# Patient Record
Sex: Female | Born: 1950
Health system: Southern US, Community
[De-identification: ages and names within clinical notes are randomized; demographics above are authoritative.]

## PROBLEM LIST (undated history)

## (undated) DIAGNOSIS — I10 Essential (primary) hypertension: Secondary | ICD-10-CM

## (undated) DIAGNOSIS — E079 Disorder of thyroid, unspecified: Secondary | ICD-10-CM

## (undated) HISTORY — PX: ABDOMINAL HYSTERECTOMY: SHX81

---

## 1997-07-21 ENCOUNTER — Ambulatory Visit (HOSPITAL_COMMUNITY): Admission: RE | Admit: 1997-07-21 | Discharge: 1997-07-21 | Payer: Self-pay | Admitting: Obstetrics and Gynecology

## 1998-03-18 ENCOUNTER — Other Ambulatory Visit: Admission: RE | Admit: 1998-03-18 | Discharge: 1998-03-18 | Payer: Self-pay | Admitting: Obstetrics and Gynecology

## 1998-08-29 ENCOUNTER — Ambulatory Visit (HOSPITAL_COMMUNITY): Admission: RE | Admit: 1998-08-29 | Discharge: 1998-08-29 | Payer: Self-pay | Admitting: Obstetrics and Gynecology

## 1999-05-29 ENCOUNTER — Other Ambulatory Visit: Admission: RE | Admit: 1999-05-29 | Discharge: 1999-05-29 | Payer: Self-pay | Admitting: Obstetrics and Gynecology

## 1999-09-13 ENCOUNTER — Ambulatory Visit (HOSPITAL_COMMUNITY): Admission: RE | Admit: 1999-09-13 | Discharge: 1999-09-13 | Payer: Self-pay | Admitting: Obstetrics and Gynecology

## 1999-09-13 ENCOUNTER — Encounter: Payer: Self-pay | Admitting: Obstetrics and Gynecology

## 2000-06-13 ENCOUNTER — Other Ambulatory Visit: Admission: RE | Admit: 2000-06-13 | Discharge: 2000-06-13 | Payer: Self-pay | Admitting: Obstetrics and Gynecology

## 2001-01-22 ENCOUNTER — Ambulatory Visit (HOSPITAL_COMMUNITY): Admission: RE | Admit: 2001-01-22 | Discharge: 2001-01-22 | Payer: Self-pay | Admitting: Obstetrics and Gynecology

## 2001-01-22 ENCOUNTER — Encounter: Payer: Self-pay | Admitting: Obstetrics and Gynecology

## 2001-07-29 ENCOUNTER — Other Ambulatory Visit: Admission: RE | Admit: 2001-07-29 | Discharge: 2001-07-29 | Payer: Self-pay | Admitting: Obstetrics and Gynecology

## 2002-08-14 ENCOUNTER — Other Ambulatory Visit: Admission: RE | Admit: 2002-08-14 | Discharge: 2002-08-14 | Payer: Self-pay | Admitting: Obstetrics and Gynecology

## 2003-01-19 ENCOUNTER — Other Ambulatory Visit: Admission: RE | Admit: 2003-01-19 | Discharge: 2003-01-19 | Payer: Self-pay | Admitting: Gynecology

## 2003-08-30 ENCOUNTER — Other Ambulatory Visit: Admission: RE | Admit: 2003-08-30 | Discharge: 2003-08-30 | Payer: Self-pay | Admitting: Obstetrics and Gynecology

## 2004-02-23 ENCOUNTER — Other Ambulatory Visit: Admission: RE | Admit: 2004-02-23 | Discharge: 2004-02-23 | Payer: Self-pay | Admitting: Obstetrics and Gynecology

## 2004-10-03 ENCOUNTER — Other Ambulatory Visit: Admission: RE | Admit: 2004-10-03 | Discharge: 2004-10-03 | Payer: Self-pay | Admitting: Obstetrics and Gynecology

## 2004-12-15 ENCOUNTER — Ambulatory Visit (HOSPITAL_BASED_OUTPATIENT_CLINIC_OR_DEPARTMENT_OTHER): Admission: RE | Admit: 2004-12-15 | Discharge: 2004-12-15 | Payer: Self-pay | Admitting: General Surgery

## 2004-12-15 ENCOUNTER — Ambulatory Visit (HOSPITAL_COMMUNITY): Admission: RE | Admit: 2004-12-15 | Discharge: 2004-12-15 | Payer: Self-pay | Admitting: General Surgery

## 2004-12-15 ENCOUNTER — Encounter (INDEPENDENT_AMBULATORY_CARE_PROVIDER_SITE_OTHER): Payer: Self-pay | Admitting: *Deleted

## 2007-02-17 ENCOUNTER — Ambulatory Visit: Payer: Self-pay | Admitting: Gastroenterology

## 2007-03-03 ENCOUNTER — Ambulatory Visit: Payer: Self-pay | Admitting: Gastroenterology

## 2010-10-06 NOTE — Op Note (Signed)
Linda Goodwin, [name]                   ACCOUNT NO.:  [id number]   MEDICAL RECORD NO.:  [id number]          PATIENT TYPE:  AMB   LOCATION:  DSC                          FACILITY:  [facility]   PHYSICIAN:  Gita Kudo, M.D. DATE OF BIRTH:  July 26, 1950   DATE OF PROCEDURE:  12/15/2004  DATE OF DISCHARGE:                                 OPERATIVE REPORT   OPERATIVE PROCEDURE:  Excision left thigh mass, excision of skin lesion left  thigh.   SURGEON:  Gita Kudo, M.D.   ANESTHESIA:  MAC - IV sedation, local mixture 1% Xylocaine - 0.5% Marcaine  with epinephrine.   PREOPERATIVE DIAGNOSIS:  Mass left thigh, possible lipoma, nevus left thigh.   POSTOPERATIVE DIAGNOSIS:  Mass left thigh, possible lipoma, nevus left  thigh.   CLINICAL SUMMARY:  60 year old female found a lump in her left thigh that  she did not notice before approximately a month or so ago. She also has a  nevus nearby. Comes in for excision of both.   OPERATIVE FINDINGS:  The mass in the upper medial thigh was above the fascia  and deep to the subcu and looked like a benign lipoma approximately 5 cm in  length. The nevus appeared benign.   OPERATIVE PROCEDURE:  Under satisfactory intravenous sedation, the patient  was positioned, prepped and draped in standard fashion. Approximately 30 mL  total of the above local was infiltrated for good analgesia. The mass which  had been previously marked was approached through an oblique incision  centered over it. Dissection in the subcu revealed the mass which was fairly  well encapsulated and lying on top of the fascia but not invading into the  muscle at all. It was easily removed by cautery. The wound lavaged with  saline and then closed in layers with 2-0, 3-0 Vicryl and nylon for skin.   The nevus was then excised in elliptical fashion and closed in layers with  Vicryl and nylon. Sterile absorbent dressings applied to both areas and the  patient went to the recovery room  from the operating room in good condition.       MRL/MEDQ  D:  12/15/2004  T:  12/15/2004  Job:  [id number]   cc:   Malva Limes, M.D.  [address]  [place]  [place] [address]  Fax: [phone]

## 2015-11-11 DIAGNOSIS — T148 Other injury of unspecified body region: Secondary | ICD-10-CM | POA: Diagnosis not present

## 2015-11-19 DIAGNOSIS — K529 Noninfective gastroenteritis and colitis, unspecified: Secondary | ICD-10-CM | POA: Diagnosis not present

## 2015-11-19 DIAGNOSIS — R110 Nausea: Secondary | ICD-10-CM | POA: Diagnosis not present

## 2015-12-24 DIAGNOSIS — H5200 Hypermetropia, unspecified eye: Secondary | ICD-10-CM | POA: Diagnosis not present

## 2015-12-24 DIAGNOSIS — H5210 Myopia, unspecified eye: Secondary | ICD-10-CM | POA: Diagnosis not present

## 2016-03-28 DIAGNOSIS — E0500 Thyrotoxicosis with diffuse goiter without thyrotoxic crisis or storm: Secondary | ICD-10-CM | POA: Diagnosis not present

## 2016-03-28 DIAGNOSIS — Z5181 Encounter for therapeutic drug level monitoring: Secondary | ICD-10-CM | POA: Diagnosis not present

## 2016-05-01 DIAGNOSIS — E0590 Thyrotoxicosis, unspecified without thyrotoxic crisis or storm: Secondary | ICD-10-CM | POA: Diagnosis not present

## 2016-05-07 DIAGNOSIS — E0500 Thyrotoxicosis with diffuse goiter without thyrotoxic crisis or storm: Secondary | ICD-10-CM | POA: Diagnosis not present

## 2016-05-07 DIAGNOSIS — Z5181 Encounter for therapeutic drug level monitoring: Secondary | ICD-10-CM | POA: Diagnosis not present

## 2016-05-07 DIAGNOSIS — E0590 Thyrotoxicosis, unspecified without thyrotoxic crisis or storm: Secondary | ICD-10-CM | POA: Diagnosis not present

## 2016-09-05 DIAGNOSIS — Z1389 Encounter for screening for other disorder: Secondary | ICD-10-CM | POA: Diagnosis not present

## 2016-09-05 DIAGNOSIS — Z01419 Encounter for gynecological examination (general) (routine) without abnormal findings: Secondary | ICD-10-CM | POA: Diagnosis not present

## 2016-09-05 DIAGNOSIS — Z131 Encounter for screening for diabetes mellitus: Secondary | ICD-10-CM | POA: Diagnosis not present

## 2016-09-05 DIAGNOSIS — Z1329 Encounter for screening for other suspected endocrine disorder: Secondary | ICD-10-CM | POA: Diagnosis not present

## 2016-09-05 DIAGNOSIS — Z1231 Encounter for screening mammogram for malignant neoplasm of breast: Secondary | ICD-10-CM | POA: Diagnosis not present

## 2016-11-13 DIAGNOSIS — E0500 Thyrotoxicosis with diffuse goiter without thyrotoxic crisis or storm: Secondary | ICD-10-CM | POA: Diagnosis not present

## 2016-11-13 DIAGNOSIS — Z5181 Encounter for therapeutic drug level monitoring: Secondary | ICD-10-CM | POA: Diagnosis not present

## 2016-11-13 DIAGNOSIS — E0590 Thyrotoxicosis, unspecified without thyrotoxic crisis or storm: Secondary | ICD-10-CM | POA: Diagnosis not present

## 2016-11-13 DIAGNOSIS — Z79899 Other long term (current) drug therapy: Secondary | ICD-10-CM | POA: Diagnosis not present

## 2016-12-17 DIAGNOSIS — L239 Allergic contact dermatitis, unspecified cause: Secondary | ICD-10-CM | POA: Diagnosis not present

## 2016-12-17 DIAGNOSIS — R030 Elevated blood-pressure reading, without diagnosis of hypertension: Secondary | ICD-10-CM | POA: Diagnosis not present

## 2017-01-04 DIAGNOSIS — R1013 Epigastric pain: Secondary | ICD-10-CM | POA: Diagnosis not present

## 2017-01-04 DIAGNOSIS — H9201 Otalgia, right ear: Secondary | ICD-10-CM | POA: Diagnosis not present

## 2017-01-04 DIAGNOSIS — R21 Rash and other nonspecific skin eruption: Secondary | ICD-10-CM | POA: Diagnosis not present

## 2017-02-04 DIAGNOSIS — E0590 Thyrotoxicosis, unspecified without thyrotoxic crisis or storm: Secondary | ICD-10-CM | POA: Diagnosis not present

## 2017-02-04 DIAGNOSIS — R1013 Epigastric pain: Secondary | ICD-10-CM | POA: Diagnosis not present

## 2017-02-04 DIAGNOSIS — Z23 Encounter for immunization: Secondary | ICD-10-CM | POA: Diagnosis not present

## 2017-02-04 DIAGNOSIS — Z0000 Encounter for general adult medical examination without abnormal findings: Secondary | ICD-10-CM | POA: Diagnosis not present

## 2017-02-04 DIAGNOSIS — Z136 Encounter for screening for cardiovascular disorders: Secondary | ICD-10-CM | POA: Diagnosis not present

## 2017-02-04 DIAGNOSIS — M546 Pain in thoracic spine: Secondary | ICD-10-CM | POA: Diagnosis not present

## 2017-02-04 DIAGNOSIS — D72824 Basophilia: Secondary | ICD-10-CM | POA: Diagnosis not present

## 2017-02-19 DIAGNOSIS — E0500 Thyrotoxicosis with diffuse goiter without thyrotoxic crisis or storm: Secondary | ICD-10-CM | POA: Diagnosis present

## 2017-02-19 DIAGNOSIS — R1013 Epigastric pain: Secondary | ICD-10-CM | POA: Diagnosis not present

## 2017-02-19 DIAGNOSIS — K219 Gastro-esophageal reflux disease without esophagitis: Secondary | ICD-10-CM | POA: Diagnosis present

## 2017-03-11 DIAGNOSIS — K648 Other hemorrhoids: Secondary | ICD-10-CM | POA: Diagnosis not present

## 2017-03-11 DIAGNOSIS — K219 Gastro-esophageal reflux disease without esophagitis: Secondary | ICD-10-CM | POA: Diagnosis not present

## 2017-03-11 DIAGNOSIS — K5730 Diverticulosis of large intestine without perforation or abscess without bleeding: Secondary | ICD-10-CM | POA: Diagnosis not present

## 2017-03-11 DIAGNOSIS — Z1211 Encounter for screening for malignant neoplasm of colon: Secondary | ICD-10-CM | POA: Diagnosis not present

## 2017-03-25 ENCOUNTER — Encounter: Payer: Self-pay | Admitting: Gastroenterology

## 2017-03-25 DIAGNOSIS — M8588 Other specified disorders of bone density and structure, other site: Secondary | ICD-10-CM | POA: Diagnosis not present

## 2017-04-02 DIAGNOSIS — I10 Essential (primary) hypertension: Secondary | ICD-10-CM | POA: Diagnosis not present

## 2017-04-02 DIAGNOSIS — M546 Pain in thoracic spine: Secondary | ICD-10-CM | POA: Diagnosis not present

## 2017-05-16 DIAGNOSIS — I10 Essential (primary) hypertension: Secondary | ICD-10-CM | POA: Diagnosis not present

## 2017-05-16 DIAGNOSIS — M546 Pain in thoracic spine: Secondary | ICD-10-CM | POA: Diagnosis not present

## 2017-05-24 DIAGNOSIS — Z5181 Encounter for therapeutic drug level monitoring: Secondary | ICD-10-CM | POA: Diagnosis not present

## 2017-05-24 DIAGNOSIS — E0590 Thyrotoxicosis, unspecified without thyrotoxic crisis or storm: Secondary | ICD-10-CM | POA: Diagnosis not present

## 2017-05-24 DIAGNOSIS — Z79899 Other long term (current) drug therapy: Secondary | ICD-10-CM | POA: Diagnosis not present

## 2017-05-24 DIAGNOSIS — E0500 Thyrotoxicosis with diffuse goiter without thyrotoxic crisis or storm: Secondary | ICD-10-CM | POA: Diagnosis not present

## 2017-05-28 ENCOUNTER — Other Ambulatory Visit: Payer: Self-pay | Admitting: Family Medicine

## 2017-05-28 DIAGNOSIS — M545 Low back pain, unspecified: Secondary | ICD-10-CM

## 2017-05-28 DIAGNOSIS — M546 Pain in thoracic spine: Secondary | ICD-10-CM

## 2017-06-01 ENCOUNTER — Ambulatory Visit (HOSPITAL_BASED_OUTPATIENT_CLINIC_OR_DEPARTMENT_OTHER)
Admission: RE | Admit: 2017-06-01 | Discharge: 2017-06-01 | Disposition: A | Discharge status: Home / Self Care | Payer: Medicare HMO | Source: Ambulatory Visit | Attending: Family Medicine | Admitting: Family Medicine

## 2017-06-01 DIAGNOSIS — M4854XA Collapsed vertebra, not elsewhere classified, thoracic region, initial encounter for fracture: Secondary | ICD-10-CM | POA: Diagnosis not present

## 2017-06-01 DIAGNOSIS — M546 Pain in thoracic spine: Secondary | ICD-10-CM | POA: Diagnosis not present

## 2017-06-01 DIAGNOSIS — X58XXXA Exposure to other specified factors, initial encounter: Secondary | ICD-10-CM | POA: Insufficient documentation

## 2017-06-01 DIAGNOSIS — M47816 Spondylosis without myelopathy or radiculopathy, lumbar region: Secondary | ICD-10-CM | POA: Insufficient documentation

## 2017-06-01 DIAGNOSIS — M545 Low back pain, unspecified: Secondary | ICD-10-CM

## 2017-06-07 DIAGNOSIS — M546 Pain in thoracic spine: Secondary | ICD-10-CM | POA: Diagnosis not present

## 2017-07-18 DIAGNOSIS — E0590 Thyrotoxicosis, unspecified without thyrotoxic crisis or storm: Secondary | ICD-10-CM | POA: Diagnosis not present

## 2017-07-18 DIAGNOSIS — E0500 Thyrotoxicosis with diffuse goiter without thyrotoxic crisis or storm: Secondary | ICD-10-CM | POA: Diagnosis not present

## 2017-07-30 ENCOUNTER — Encounter: Payer: Self-pay | Admitting: Physical Therapy

## 2017-07-30 ENCOUNTER — Other Ambulatory Visit: Payer: Self-pay

## 2017-07-30 ENCOUNTER — Ambulatory Visit: Payer: Medicare HMO | Attending: Orthopedic Surgery | Admitting: Physical Therapy

## 2017-07-30 DIAGNOSIS — R293 Abnormal posture: Secondary | ICD-10-CM | POA: Diagnosis not present

## 2017-07-30 DIAGNOSIS — M546 Pain in thoracic spine: Secondary | ICD-10-CM | POA: Insufficient documentation

## 2017-07-30 NOTE — Therapy (Signed)
[facility] [place], [place], [address] Phone: [phone]   Fax:  [phone]  Physical Therapy Evaluation  Patient Details  Name: Linda Goodwin MRN: [id number] Date of Birth: May 18, 1951 Referring Provider: Phylliss Bob, Md   Encounter Date: 07/30/2017  PT End of Session - 07/30/17 0859    Visit Number  1    Number of Visits  12    Date for PT Re-Evaluation  09/10/17    Authorization Type  FOTO every 5th visit    PT Start Time  0817    PT Stop Time  0859    PT Time Calculation (min)  42 min       History reviewed. No pertinent past medical history.  History reviewed. No pertinent surgical history.  There were no vitals filed for this visit.   Subjective Assessment - 07/30/17 1242    Subjective  Patient presents to physical therapy with mid-thoracic pain. Patient she fell in late May-early June which may be the cause of the start of pain. She had minimal pain after fall but her pain increased about six months ago (September 2018) Patient stated she had recently had an MRI to which it showed a small healed fracture in the vertebrae and very mild arthritic changes. Patient describes pain as nagging and achy. Pain at worst occurs in the morning, when bending forward, and lying down7/10. Pain at best is 2-3/10 and improves with exercise and movement. Patient noted with occasional tingling down spine and across mid back. Patient's goals are to decrease pain and increase level of activities.    Pertinent History  HTN    Limitations  House hold activities;Lifting    Diagnostic tests  MRI- old fracture and mild arthritic changes    Patient Stated Goals  get rid of pain and increase activity levels    Currently in Pain?  Yes    Pain Score  6     Pain Location  Back    Pain Orientation  Mid    Pain Descriptors / Indicators  Nagging;Aching    Pain Type  Chronic pain    Pain Onset  More than a month ago    Pain Frequency   Intermittent    Aggravating Factors   laying down    Pain Relieving Factors  moving or walking    Effect of Pain on Daily Activities  increase pain with household activities         [facility] PT Assessment - 07/30/17 0001      Assessment   Medical Diagnosis  Thoracic pain    Referring Provider  Phylliss Bob, Md    Onset Date/Surgical Date  01/30/17    Prior Therapy  no      Balance Screen   Has the patient fallen in the past 6 months  No    Has the patient had a decrease in activity level because of a fear of falling?   No    Is the patient reluctant to leave their home because of a fear of falling?   No      Home Environment   Living Environment  Private residence    Living Arrangements  Spouse/significant other      Prior Function   Level of Independence  Independent    Vocation  Full time employment      Observation/Other Assessments   Focus on Therapeutic Outcomes (FOTO)   33% limited      Posture/Postural Control  Posture/Postural Control  Postural limitations    Postural Limitations  Rounded Shoulders;Forward head;Increased lumbar lordosis;Increased thoracic kyphosis      ROM / Strength   AROM / PROM / Strength  AROM;Strength      AROM   Overall AROM   Within functional limits for tasks performed    Overall AROM Comments  cervical and shoulder AROM WFL; lumbar flexion finger tip to floor:7.5 inches (+) "pulling" in mid back      Strength   Overall Strength Comments  bilateral middle trapezius: 3+/5, bilateral lower trapeizus 3/5    Strength Assessment Site  Thoracic;Shoulder    Right/Left Shoulder  Left;Right    Right Shoulder Flexion  4+/5    Right Shoulder ABduction  4/5    Right Shoulder Internal Rotation  4+/5    Right Shoulder External Rotation  4+/5    Left Shoulder Flexion  4+/5    Left Shoulder ABduction  4/5    Left Shoulder Internal Rotation  4+/5    Left Shoulder External Rotation  4+/5      Palpation   Palpation comment  increased tender to  palpation around T5-T7 paraspinals,decreased thoracic joint play mobility T1-T8 2/6       Special Tests    Special Tests  Lumbar    Lumbar Tests  Slump Test      Slump test   Findings  Negative    Comment  no reproduction of thoracic neurological symptoms             Objective measurements completed on examination: See above findings.              PT Education - 07/30/17 1257    Education provided  Yes    Education Details  scapular retactions, shoulder rolls, corner stretch    Person(s) Educated  Patient    Methods  Explanation;Demonstration;Handout    Comprehension  Verbalized understanding;Returned demonstration          PT Long Term Goals - 07/30/17 1257      PT LONG TERM GOAL #1   Title  Patient will be independent with HEP.    Time  6    Period  Weeks    Status  New    Target Date  09/10/17      PT LONG TERM GOAL #2   Title  Patient will improve shoulder/scapular strength to 5/5 to improve stability during functional activities    Time  6    Period  Weeks    Status  New    Target Date  09/10/17      PT LONG TERM GOAL #3   Title  Patient will improve lumbar flexion finger tip to floor to less than 5 inches with no reports of "pulling" in lumbar spine    Time  6    Period  Weeks    Status  New    Target Date  09/10/17      PT LONG TERM GOAL #4   Title  Patient will improve FOTO score to less than 20% limited.    Time  6    Period  Weeks    Status  New    Target Date  09/10/17             Plan - 07/30/17 1231    Clinical Impression Statement  Patient is a 67 year old female who presents with chronic mid-thoracic pain around T5-T7. Patient has decreased thoracic spinal joint mobility and decreased middle  trapezius and lower trapezius strength. Patient noted with increased thoracic kyphosis in sitting and standing. Patient also noted with increased tenderness to palpation along T5-T7 paraspinals. Patient demonstrated [facility] cervical and  shoulder AROM and lumbar flexion finger tip to floor limited secondary to "pulling" in mid back. Patient would benefit from skilled physical therapy to decrease pain, improve thoracic mobility, and improve overall posture to return to PLOF.     Clinical Presentation  Stable    Clinical Decision Making  Low    Rehab Potential  Excellent    PT Frequency  2x / week    PT Duration  6 weeks    PT Treatment/Interventions  ADLs/Self Care Home Management;Electrical Stimulation;Ultrasound;Moist Heat;Dry needling;Passive range of motion;Manual techniques;Therapeutic exercise;Therapeutic activities;Taping;Cryotherapy    PT Next Visit Plan  UBE, postural exercises in standing and prone. Modalites PRN for pain relief    PT Home Exercise Plan  Scapular retractions, shoulder rolls with emphasis on scap rectractions, and corner stretch    Consulted and Agree with Plan of Care  Patient       Patient will benefit from skilled therapeutic intervention in order to improve the following deficits and impairments:  Hypomobility, Decreased strength, Pain, Postural dysfunction  Visit Diagnosis: Pain in thoracic spine  Abnormal posture     Problem List There are no active problems to display for this patient.  Gabriela Eves, PT, DPT  07/30/2017, 8:56 PM  [facility] [address] [place], [place], [address] Phone: [phone]   Fax:  [phone]  Name: SHINIKA ESTELLE MRN: [id number] Date of Birth: [birth year]

## 2017-07-30 NOTE — Patient Instructions (Signed)
   Ladiamond Gallina, PT, DPT [facility] [address] [place], [place], [address] Phone: [phone]   Fax:  [phone]  

## 2017-08-01 ENCOUNTER — Encounter: Payer: Self-pay | Admitting: Physical Therapy

## 2017-08-01 ENCOUNTER — Ambulatory Visit: Payer: Medicare HMO | Admitting: Physical Therapy

## 2017-08-01 DIAGNOSIS — R293 Abnormal posture: Secondary | ICD-10-CM | POA: Diagnosis not present

## 2017-08-01 DIAGNOSIS — M546 Pain in thoracic spine: Secondary | ICD-10-CM | POA: Diagnosis not present

## 2017-08-01 NOTE — Therapy (Signed)
[facility] [place], [place], [address] Phone: [phone]   Fax:  [phone]  Physical Therapy Treatment  Patient Details  Name: Linda Goodwin MRN: [id number] Date of Birth: [birth year] Referring Provider: Phylliss Bob, Md   Encounter Date: 08/01/2017  PT End of Session - 08/01/17 0808    Visit Number  2    Number of Visits  12    Date for PT Re-Evaluation  09/10/17    Authorization Type  FOTO every 5th visit    PT Start Time  0733    PT Stop Time  0821    PT Time Calculation (min)  48 min    Activity Tolerance  Patient tolerated treatment well    Behavior During Therapy  [facility] for tasks assessed/performed       History reviewed. No pertinent past medical history.  History reviewed. No pertinent surgical history.  There were no vitals filed for this visit.  Subjective Assessment - 08/01/17 0737    Subjective  Patient arrived with some discomfort and would like to get back to normal    Pertinent History  HTN    Limitations  House hold activities;Lifting    Diagnostic tests  MRI- old fracture and mild arthritic changes    Currently in Pain?  Yes    Pain Score  4     Pain Location  Thoracic    Pain Descriptors / Indicators  Aching;Nagging;Discomfort    Pain Type  Chronic pain    Pain Onset  More than a month ago    Pain Frequency  Intermittent    Aggravating Factors   first thing in the morning    Pain Relieving Factors  movement and activity                      [facility] Adult PT Treatment/Exercise - 08/01/17 0001      Exercises   Exercises  Shoulder      Shoulder Exercises: Prone   Retraction  Strengthening;Both;10 reps    Flexion  Strengthening;Both;10 reps    Horizontal ABduction 1  Strengthening;Both;10 reps      Shoulder Exercises: ROM/Strengthening   UBE (Upper Arm Bike)  x98min @ 120RPM    Wall Pushups  20 reps      Modalities   Modalities  Electrical Stimulation;Moist Heat       Moist Heat Therapy   Number Minutes Moist Heat  15 Minutes    Moist Heat Location  Other (comment) thoracis      Electrical Stimulation   Electrical Stimulation Location  thoracic    Electrical Stimulation Action  IFC 80-150hz  x38min    Electrical Stimulation Goals  Pain;Tone      Manual Therapy   Manual Therapy  Soft tissue mobilization;Myofascial release    Soft tissue mobilization  manual trigger point release/STW to bil thoracic paraspinals to reduce pain and tightness             PT Education - 08/01/17 0814    Education provided  Yes    Education Details  HEP    Person(s) Educated  Patient    Methods  Explanation;Demonstration;Handout    Comprehension  Returned demonstration;Verbalized understanding          PT Long Term Goals - 08/01/17 0809      PT LONG TERM GOAL #1   Title  Patient will be independent with HEP.    Time  6    Period  Weeks    Status  On-going      PT LONG TERM GOAL #2   Title  Patient will improve shoulder/scapular strength to 5/5 to improve stability during functional activities    Time  6    Period  Weeks    Status  On-going      PT LONG TERM GOAL #3   Title  Patient will improve lumbar flexion finger tip to floor to less than 5 inches with no reports of "pulling" in lumbar spine    Time  6    Period  Weeks    Status  On-going      PT LONG TERM GOAL #4   Title  Patient will improve FOTO score to less than 20% limited.    Time  6    Period  Weeks    Status  On-going            Plan - 08/01/17 1610    Clinical Impression Statement  Patient tolerated treatment well today. Patient able to start thoracic/posture strengthening today. Patient given HEP. Patient has increased tightness in bil thoracic area with palpable discomfort. Patient understands importance of posture awareness techniques and strength progression. Goals ongoing at this time.     Rehab Potential  Excellent    PT Frequency  2x / week    PT Duration  6 weeks     PT Treatment/Interventions  ADLs/Self Care Home Management;Electrical Stimulation;Ultrasound;Moist Heat;Dry needling;Passive range of motion;Manual techniques;Therapeutic exercise;Therapeutic activities;Taping;Cryotherapy    PT Next Visit Plan  cont with POC for postural exercises in standing and prone/STW/ Modalites PRN for pain relief    Consulted and Agree with Plan of Care  Patient       Patient will benefit from skilled therapeutic intervention in order to improve the following deficits and impairments:  Hypomobility, Decreased strength, Pain, Postural dysfunction  Visit Diagnosis: Pain in thoracic spine  Abnormal posture     Problem List There are no active problems to display for this patient.   Phillips Climes, PTA 08/01/2017, 8:22 AM  [facility] [place], [place], [address] Phone: [phone]   Fax:  [phone]  Name: ZURY FAZZINO MRN: [id number] Date of Birth: 05-24-1950

## 2017-08-01 NOTE — Patient Instructions (Addendum)
Scapular Retraction: Abduction (Prone)    Lie with upper arms straight out from sides, elbows bent to 90. Pinch shoulder blades together and raise arms a few inches from floor. Repeat _10___ times per set. Do __1-2__ sets per session. Do _1-2___ sessions per day.   Scapular Retraction: Abduction / Extension (Prone)    Lie with arms out from sides 90. Pinch shoulder blades together and raise arms a few inches from floor. Repeat _10___ times per set. Do _1-2___ sets per session. Do __1-2__ sessions per day.   Scapular Retraction: Flexion (Prone)    Lie with arms forward. Pinch shoulder blades together and raise arms a few inches from floor. Repeat _10___ times per set. Do _1-2___ sets per session. Do _1-2___ sessions per day.

## 2017-08-08 ENCOUNTER — Ambulatory Visit: Payer: Medicare HMO | Admitting: Physical Therapy

## 2017-08-08 ENCOUNTER — Encounter: Payer: Self-pay | Admitting: Physical Therapy

## 2017-08-08 DIAGNOSIS — M546 Pain in thoracic spine: Secondary | ICD-10-CM | POA: Diagnosis not present

## 2017-08-08 DIAGNOSIS — R293 Abnormal posture: Secondary | ICD-10-CM | POA: Diagnosis not present

## 2017-08-08 NOTE — Therapy (Signed)
[facility] [place], [place], [address] Phone: [phone]   Fax:  [phone]  Physical Therapy Treatment  Patient Details  Name: Linda Goodwin MRN: [id number] Date of Birth: 08-11-1950 Referring Provider: Phylliss Bob, Md   Encounter Date: 08/08/2017  PT End of Session - 08/08/17 1737    Visit Number  3    Number of Visits  12    Date for PT Re-Evaluation  09/10/17    Authorization Type  FOTO every 5th visit    PT Start Time  1649    PT Stop Time  1732    PT Time Calculation (min)  43 min    Activity Tolerance  Patient tolerated treatment well    Behavior During Therapy  [facility] for tasks assessed/performed       History reviewed. No pertinent past medical history.  History reviewed. No pertinent surgical history.  There were no vitals filed for this visit.  Subjective Assessment - 08/08/17 1703    Subjective  Reports today is actually a better day for pain. Reports her back feels better but she is still sore. Reports a nagging, sore pain.    Pertinent History  HTN    Limitations  House hold activities;Lifting    Diagnostic tests  MRI- old fracture and mild arthritic changes    Patient Stated Goals  get rid of pain and increase activity levels    Currently in Pain?  Yes    Pain Score  4     Pain Location  Back    Pain Orientation  Mid    Pain Descriptors / Indicators  Nagging;Sore    Pain Type  Chronic pain    Pain Onset  More than a month ago    Pain Frequency  Constant    Aggravating Factors   Mornings    Pain Relieving Factors  Heat, massage         [facility] PT Assessment - 08/08/17 0001      Assessment   Medical Diagnosis  Thoracic pain    Onset Date/Surgical Date  01/30/17    Prior Therapy  no                  [facility] Adult PT Treatment/Exercise - 08/08/17 0001      Shoulder Exercises: Prone   Other Prone Exercises  WITTY AROM x4 reps      Shoulder Exercises: Standing   Horizontal  ABduction  Strengthening;Both;15 reps;Theraband    Theraband Level (Shoulder Horizontal ABduction)  Level 1 (Yellow)    External Rotation  Strengthening;Both;15 reps;Theraband    Theraband Level (Shoulder External Rotation)  Level 1 (Yellow)    Other Standing Exercises  B D2 strengthening yellow theraband x15 reps each    Other Standing Exercises  Wall walks yellow theraband x2; Snow angels x15 reps      Shoulder Exercises: ROM/Strengthening   UBE (Upper Arm Bike)  90 RPM x6 min (3 min forward, 3 min backward)    Wall Pushups  20 reps    X to V Arms  x10 reps      Modalities   Modalities  Electrical Stimulation;Moist Heat      Moist Heat Therapy   Number Minutes Moist Heat  15 Minutes    Moist Heat Location  Other (comment) Thoracic spine      Electrical Stimulation   Electrical Stimulation Location  B thoracic paraspinals    Electrical Stimulation Action  IFC    Electrical  Stimulation Parameters  80-150 hz x15 min    Electrical Stimulation Goals  Pain                  PT Long Term Goals - 08/01/17 0809      PT LONG TERM GOAL #1   Title  Patient will be independent with HEP.    Time  6    Period  Weeks    Status  On-going      PT LONG TERM GOAL #2   Title  Patient will improve shoulder/scapular strength to 5/5 to improve stability during functional activities    Time  6    Period  Weeks    Status  On-going      PT LONG TERM GOAL #3   Title  Patient will improve lumbar flexion finger tip to floor to less than 5 inches with no reports of "pulling" in lumbar spine    Time  6    Period  Weeks    Status  On-going      PT LONG TERM GOAL #4   Title  Patient will improve FOTO score to less than 20% limited.    Time  6    Period  Weeks    Status  On-going            Plan - 08/08/17 1802    Clinical Impression Statement  Patient guided through postural exercises without complaint of pain. Patient still experiencing "nagging, constant" especially  surrounding the fracture site. Patient guided through treatment with demo and VCs to ensure proper form with emphasis on proper posture. Normal modalities response noted following removal of the modalities.    Rehab Potential  Excellent    PT Frequency  2x / week    PT Duration  6 weeks    PT Treatment/Interventions  ADLs/Self Care Home Management;Electrical Stimulation;Ultrasound;Moist Heat;Dry needling;Passive range of motion;Manual techniques;Therapeutic exercise;Therapeutic activities;Taping;Cryotherapy    PT Next Visit Plan  cont with POC for postural exercises in standing and prone/STW/ Modalites PRN for pain relief    PT Home Exercise Plan  Scapular retractions, shoulder rolls with emphasis on scap rectractions, and corner stretch    Consulted and Agree with Plan of Care  Patient       Patient will benefit from skilled therapeutic intervention in order to improve the following deficits and impairments:  Hypomobility, Decreased strength, Pain, Postural dysfunction  Visit Diagnosis: Pain in thoracic spine  Abnormal posture     Problem List There are no active problems to display for this patient.   Standley Brooking, PTA 08/08/2017, 6:14 PM  [facility] [place], [place], [address] Phone: [phone]   Fax:  [phone]  Name: Linda Goodwin MRN: [id number] Date of Birth: 01/01/1951

## 2017-08-09 ENCOUNTER — Encounter: Payer: Self-pay | Admitting: Physical Therapy

## 2017-08-09 ENCOUNTER — Ambulatory Visit: Payer: Medicare HMO | Admitting: Physical Therapy

## 2017-08-09 DIAGNOSIS — M546 Pain in thoracic spine: Secondary | ICD-10-CM

## 2017-08-09 DIAGNOSIS — R293 Abnormal posture: Secondary | ICD-10-CM | POA: Diagnosis not present

## 2017-08-09 NOTE — Therapy (Signed)
[facility] [place], [place], [address] Phone: [phone]   Fax:  [phone]  Physical Therapy Treatment  Patient Details  Name: Linda Goodwin MRN: [id number] Date of Birth: March 27, 1951 Referring Provider: Phylliss Bob, Md   Encounter Date: 08/09/2017  PT End of Session - 08/09/17 3295    Visit Number  4    Number of Visits  12    Date for PT Re-Evaluation  09/10/17    Authorization Type  FOTO every 5th visit    PT Start Time  0817    PT Stop Time  0905    PT Time Calculation (min)  48 min    Activity Tolerance  Patient tolerated treatment well    Behavior During Therapy  [facility] for tasks assessed/performed       History reviewed. No pertinent past medical history.  History reviewed. No pertinent surgical history.  There were no vitals filed for this visit.  Subjective Assessment - 08/09/17 0817    Subjective  Reports that she feels pretty good and the pain has definately been worse.    Pertinent History  HTN    Limitations  House hold activities;Lifting    Diagnostic tests  MRI- old fracture and mild arthritic changes    Patient Stated Goals  get rid of pain and increase activity levels    Currently in Pain?  Yes    Pain Score  3     Pain Location  Back    Pain Orientation  Mid    Pain Descriptors / Indicators  Discomfort    Pain Type  Chronic pain    Pain Onset  More than a month ago         [facility] PT Assessment - 08/09/17 0001      Assessment   Medical Diagnosis  Thoracic pain    Onset Date/Surgical Date  01/30/17    Next MD Visit  None    Prior Therapy  no                  [facility] Adult PT Treatment/Exercise - 08/09/17 0001      Shoulder Exercises: Prone   Other Prone Exercises  WITTY AROM x5 reps      Shoulder Exercises: Standing   Horizontal ABduction  Strengthening;Both;20 reps;Theraband    Theraband Level (Shoulder Horizontal ABduction)  Level 1 (Yellow)    External Rotation   Strengthening;Both;20 reps;Theraband    Theraband Level (Shoulder External Rotation)  Level 1 (Yellow)    Row  Strengthening;Both;20 reps;Limitations    Row Limitations  Pink XTS    Other Standing Exercises  B D2 strengthening yellow theraband x20 reps each      Shoulder Exercises: ROM/Strengthening   UBE (Upper Arm Bike)  90 RPM x6 min (3 min forward, 3 min backward)    Lat Pull  Other (comment) pink XTS x20 reps    Wall Wash  BUE CW/CCW x20 reps    Wall Pushups  20 reps    "W" Arms  2x10 reps    X to V Arms  2x10 reps      Shoulder Exercises: Stretch   Corner Stretch  4 reps;10 seconds      Modalities   Modalities  Electrical Stimulation;Moist Heat      Moist Heat Therapy   Number Minutes Moist Heat  15 Minutes    Moist Heat Location  Other (comment) Thoracic spine      Acupuncturist  Stimulation Location  B thoracic paraspinals    Electrical Stimulation Action  IFC    Electrical Stimulation Parameters  80-150 hz x15 min    Electrical Stimulation Goals  Pain                  PT Long Term Goals - 08/01/17 0809      PT LONG TERM GOAL #1   Title  Patient will be independent with HEP.    Time  6    Period  Weeks    Status  On-going      PT LONG TERM GOAL #2   Title  Patient will improve shoulder/scapular strength to 5/5 to improve stability during functional activities    Time  6    Period  Weeks    Status  On-going      PT LONG TERM GOAL #3   Title  Patient will improve lumbar flexion finger tip to floor to less than 5 inches with no reports of "pulling" in lumbar spine    Time  6    Period  Weeks    Status  On-going      PT LONG TERM GOAL #4   Title  Patient will improve FOTO score to less than 20% limited.    Time  6    Period  Weeks    Status  On-going            Plan - 08/09/17 0944    Clinical Impression Statement  Patient continues to tolerate treatment well with no complaints of any increased pain. Patient  continues to report pain as a nagging pain. Improve form noted with prone WITTY exercises today. Normal modalities response noted following removal of the modalities.    Rehab Potential  Excellent    PT Frequency  2x / week    PT Duration  6 weeks    PT Treatment/Interventions  ADLs/Self Care Home Management;Electrical Stimulation;Ultrasound;Moist Heat;Dry needling;Passive range of motion;Manual techniques;Therapeutic exercise;Therapeutic activities;Taping;Cryotherapy    PT Next Visit Plan  cont with POC for postural exercises in standing and prone/STW/ Modalites PRN for pain relief    PT Home Exercise Plan  Scapular retractions, shoulder rolls with emphasis on scap rectractions, and corner stretch    Consulted and Agree with Plan of Care  Patient       Patient will benefit from skilled therapeutic intervention in order to improve the following deficits and impairments:  Hypomobility, Decreased strength, Pain, Postural dysfunction  Visit Diagnosis: Pain in thoracic spine  Abnormal posture     Problem List There are no active problems to display for this patient.   Standley Brooking, PTA 08/09/2017, 9:46 AM  [facility] [address] [place], [place], [address] Phone: [phone]   Fax:  [phone]  Name: Linda Goodwin MRN: [id number] Date of Birth: August 11, 1950

## 2017-08-13 ENCOUNTER — Encounter: Payer: Self-pay | Admitting: *Deleted

## 2017-08-13 ENCOUNTER — Ambulatory Visit: Payer: Medicare HMO | Admitting: *Deleted

## 2017-08-13 DIAGNOSIS — R293 Abnormal posture: Secondary | ICD-10-CM | POA: Diagnosis not present

## 2017-08-13 DIAGNOSIS — M546 Pain in thoracic spine: Secondary | ICD-10-CM | POA: Diagnosis not present

## 2017-08-13 NOTE — Therapy (Signed)
[facility] [place], [place], [address] Phone: [phone]   Fax:  [phone]  Physical Therapy Treatment  Patient Details  Name: Linda Goodwin MRN: [id number] Date of Birth: 1951-01-08 Referring Provider: Phylliss Bob, Md   Encounter Date: 08/13/2017  PT End of Session - 08/13/17 4709    Visit Number  5    Number of Visits  12    Date for PT Re-Evaluation  09/10/17    Authorization Type  FOTO every 5th visit      5th visit taken    PT Start Time  0815    PT Stop Time  0904    PT Time Calculation (min)  49 min       History reviewed. No pertinent past medical history.  History reviewed. No pertinent surgical history.  There were no vitals filed for this visit.  Subjective Assessment - 08/13/17 0821    Subjective  Doing better today with mainly soreness    Pertinent History  HTN    Limitations  House hold activities;Lifting    Diagnostic tests  MRI- old fracture and mild arthritic changes    Patient Stated Goals  get rid of pain and increase activity levels    Currently in Pain?  Yes    Pain Score  3     Pain Location  Back    Pain Orientation  Mid    Pain Descriptors / Indicators  Discomfort    Pain Onset  More than a month ago    Pain Frequency  Constant                No data recorded       [facility] Adult PT Treatment/Exercise - 08/13/17 0001      Shoulder Exercises: Standing   Row  Strengthening;Both;20 reps;Limitations 4x10    Row Limitations  Pink XTS      Shoulder Exercises: ROM/Strengthening   UBE (Upper Arm Bike)  90 RPM x6 min (3 min forward, 3 min backward)    Lat Pull  Other (comment) pink XTS 2 x20 reps, ROWS 4x10    Wall Pushups  20 reps    "W" Arms  3x10 reps      Shoulder Exercises: Stretch   Corner Stretch  5 reps;30 seconds      Modalities   Modalities  Electrical Stimulation;Moist Heat      Moist Heat Therapy   Number Minutes Moist Heat  15 Minutes    Moist Heat  Location  Other (comment) Thoracic spine      Electrical Stimulation   Electrical Stimulation Location  B thoracic paraspinals IFC x 15 mins 80-150hz     Electrical Stimulation Goals  Pain                  PT Long Term Goals - 08/01/17 0809      PT LONG TERM GOAL #1   Title  Patient will be independent with HEP.    Time  6    Period  Weeks    Status  On-going      PT LONG TERM GOAL #2   Title  Patient will improve shoulder/scapular strength to 5/5 to improve stability during functional activities    Time  6    Period  Weeks    Status  On-going      PT LONG TERM GOAL #3   Title  Patient will improve lumbar flexion finger tip to floor to less than 5  inches with no reports of "pulling" in lumbar spine    Time  6    Period  Weeks    Status  On-going      PT LONG TERM GOAL #4   Title  Patient will improve FOTO score to less than 20% limited.    Time  6    Period  Weeks    Status  On-going            Plan - 08/13/17 0857    Clinical Impression Statement  Pt arrived today doing fairly well with pain 3-4/10. She was able to perform all posture and strengthening Exs with minimal complaint except fatigue. Her FOTO improved to 31% limited today. Normal response to modalities.     Rehab Potential  Excellent    PT Frequency  2x / week    PT Duration  6 weeks    PT Treatment/Interventions  ADLs/Self Care Home Management;Electrical Stimulation;Ultrasound;Moist Heat;Dry needling;Passive range of motion;Manual techniques;Therapeutic exercise;Therapeutic activities;Taping;Cryotherapy    PT Next Visit Plan  cont with POC for postural exercises in standing and prone/STW/ Modalites PRN for pain relief    PT Home Exercise Plan  Scapular retractions, shoulder rolls with emphasis on scap rectractions, and corner stretch       Patient will benefit from skilled therapeutic intervention in order to improve the following deficits and impairments:  Hypomobility, Decreased strength,  Pain, Postural dysfunction  Visit Diagnosis: Pain in thoracic spine  Abnormal posture     Problem List There are no active problems to display for this patient.   Gearldean Lomanto,CHRIS, PTA 08/13/2017, 9:10 AM  [facility] [place], [place], [address] Phone: [phone]   Fax:  [phone]  Name: Linda Goodwin MRN: [id number] Date of Birth: 1951/05/14

## 2017-08-15 ENCOUNTER — Encounter: Payer: Self-pay | Admitting: *Deleted

## 2017-08-15 ENCOUNTER — Ambulatory Visit: Payer: Medicare HMO | Admitting: *Deleted

## 2017-08-15 DIAGNOSIS — M546 Pain in thoracic spine: Secondary | ICD-10-CM

## 2017-08-15 DIAGNOSIS — R293 Abnormal posture: Secondary | ICD-10-CM

## 2017-08-15 NOTE — Therapy (Signed)
[facility] [place], [place], [address] Phone: [phone]   Fax:  [phone]  Physical Therapy Treatment  Patient Details  Name: Linda Goodwin MRN: [id number] Date of Birth: 03/05/1951 Referring Provider: Phylliss Bob, Md   Encounter Date: 08/15/2017  PT End of Session - 08/15/17 1726    Visit Number  6    Number of Visits  12    Date for PT Re-Evaluation  09/10/17    Authorization Type  FOTO every 5th visit      5th visit taken 31%    PT Start Time  1651    PT Stop Time  1735    PT Time Calculation (min)  44 min       History reviewed. No pertinent past medical history.  History reviewed. No pertinent surgical history.  There were no vitals filed for this visit.  Subjective Assessment - 08/15/17 1653    Subjective  Doing better today with mainly soreness. Did ok after last Rx    Limitations  House hold activities;Lifting    Diagnostic tests  MRI- old fracture and mild arthritic changes    Patient Stated Goals  get rid of pain and increase activity levels    Currently in Pain?  Yes    Pain Score  3     Pain Location  Back    Pain Orientation  Mid                No data recorded       [facility] Adult PT Treatment/Exercise - 08/15/17 0001      Shoulder Exercises: Standing   Extension  Strengthening;Both XTS 4x10 lat pulldown pink    Row  Strengthening;Both;Limitations 4x10    Row Limitations  Pink XTS      Shoulder Exercises: ROM/Strengthening   UBE (Upper Arm Bike)  90 RPM x6 min (3 min forward, 3 min backward)    Wall Pushups  20 reps;10 reps    "W" Arms  3x10 reps with lumbar towel roll      Shoulder Exercises: Stretch   Corner Stretch  5 reps;30 seconds      Modalities   Modalities  Electrical Stimulation;Moist Heat      Moist Heat Therapy   Number Minutes Moist Heat  15 Minutes    Moist Heat Location  Other (comment) Thoracic spine      Electrical Stimulation   Electrical  Stimulation Location  B thoracic paraspinals IFC x 15 mins 80-150hz     Electrical Stimulation Goals  Pain                  PT Long Term Goals - 08/01/17 0809      PT LONG TERM GOAL #1   Title  Patient will be independent with HEP.    Time  6    Period  Weeks    Status  On-going      PT LONG TERM GOAL #2   Title  Patient will improve shoulder/scapular strength to 5/5 to improve stability during functional activities    Time  6    Period  Weeks    Status  On-going      PT LONG TERM GOAL #3   Title  Patient will improve lumbar flexion finger tip to floor to less than 5 inches with no reports of "pulling" in lumbar spine    Time  6    Period  Weeks    Status  On-going  PT LONG TERM GOAL #4   Title  Patient will improve FOTO score to less than 20% limited.    Time  6    Period  Weeks    Status  On-going            Plan - 08/15/17 1735    Clinical Impression Statement  Pt arrived today and was doing fairly well with Thoracic pain only2-3/10. She was able to perform postural Exs with mainly fatigue and pain still 2-3/10. Her FOTOthis week was 31% limited compared to 33% on Eval.    Clinical Presentation  Stable    Clinical Decision Making  Low    Rehab Potential  Excellent    PT Frequency  2x / week    PT Treatment/Interventions  ADLs/Self Care Home Management;Electrical Stimulation;Ultrasound;Moist Heat;Dry needling;Passive range of motion;Manual techniques;Therapeutic exercise;Therapeutic activities;Taping;Cryotherapy    PT Next Visit Plan  cont with POC for postural exercises in standing and prone/STW/ Modalites PRN for pain relief    PT Home Exercise Plan  Scapular retractions, shoulder rolls with emphasis on scap rectractions, and corner stretch    Consulted and Agree with Plan of Care  Patient       Patient will benefit from skilled therapeutic intervention in order to improve the following deficits and impairments:  Hypomobility, Decreased strength,  Pain, Postural dysfunction  Visit Diagnosis: Pain in thoracic spine  Abnormal posture     Problem List There are no active problems to display for this patient.   Chrysten Woulfe,CHRIS, PTA 08/15/2017, 5:44 PM  [facility] [address] [place], [place], [address] Phone: [phone]   Fax:  [phone]  Name: Linda Goodwin MRN: [id number] Date of Birth: 13-Aug-1950

## 2017-08-19 ENCOUNTER — Encounter: Payer: Self-pay | Admitting: Physical Therapy

## 2017-08-19 ENCOUNTER — Ambulatory Visit: Payer: Medicare HMO | Attending: Orthopedic Surgery | Admitting: Physical Therapy

## 2017-08-19 DIAGNOSIS — R293 Abnormal posture: Secondary | ICD-10-CM | POA: Insufficient documentation

## 2017-08-19 DIAGNOSIS — M546 Pain in thoracic spine: Secondary | ICD-10-CM | POA: Diagnosis not present

## 2017-08-19 NOTE — Therapy (Signed)
[facility] [place], [place], [address] Phone: [phone]   Fax:  [phone]  Physical Therapy Treatment  Patient Details  Name: Linda Goodwin MRN: [id number] Date of Birth: 10-11-1950 Referring Provider: Phylliss Bob, Md   Encounter Date: 08/19/2017  PT End of Session - 08/19/17 0800    Visit Number  7    Number of Visits  12    Authorization Type  FOTO every 5th visit      5th visit taken 31%    PT Start Time  0740    PT Stop Time  0828    PT Time Calculation (min)  48 min    Activity Tolerance  Patient tolerated treatment well    Behavior During Therapy  [facility] for tasks assessed/performed       History reviewed. No pertinent past medical history.  History reviewed. No pertinent surgical history.  There were no vitals filed for this visit.  Subjective Assessment - 08/19/17 0742    Subjective  No complaints after last treatment     Pertinent History  HTN    Limitations  House hold activities;Lifting    Diagnostic tests  MRI- old fracture and mild arthritic changes    Patient Stated Goals  get rid of pain and increase activity levels    Currently in Pain?  Yes    Pain Score  3     Pain Location  Back    Pain Orientation  Mid    Pain Descriptors / Indicators  Discomfort    Pain Onset  More than a month ago    Pain Frequency  Constant    Aggravating Factors   in the AM    Pain Relieving Factors  heat                No data recorded       [facility] Adult PT Treatment/Exercise - 08/19/17 0001      Shoulder Exercises: Seated   Row  Strengthening;Both;20 reps;Weights;Limitations    Row Weight (lbs)  30#      Shoulder Exercises: Standing   Horizontal ABduction  Strengthening;Both;Theraband;Limitations    Theraband Level (Shoulder Horizontal ABduction)  Level 1 (Yellow)    Horizontal ABduction Limitations  3x10    Extension  Strengthening;Both;Theraband;Limitations;Other (comment)    Theraband Level  (Shoulder Extension)  Other (comment)    Extension Limitations  pink XTS 4x10    Row  Strengthening;Both;Theraband;Limitations;Other (comment)    Theraband Level (Shoulder Row)  Other (comment)    Row Limitations  pink XTS 4x10    Diagonals  Strengthening;Both;20 reps;10 reps;Theraband;Limitations;Other (comment)    Theraband Level (Shoulder Diagonals)  Level 1 (Yellow)      Shoulder Exercises: ROM/Strengthening   UBE (Upper Arm Bike)  90 RPM x6 min (3 min forward, 3 min backward)    Wall Pushups  20 reps;10 reps      Moist Heat Therapy   Number Minutes Moist Heat  15 Minutes    Moist Heat Location  Other (comment) thoracic      Electrical Stimulation   Electrical Stimulation Location  B thoracic paraspinals IFC x 15 mins 80-150hz     Electrical Stimulation Goals  Pain             PT Education - 08/19/17 0800    Education provided  Yes    Education Details  yellow t-band for horizontal abd    Person(s) Educated  Patient    Methods  Explanation;Demonstration  Comprehension  Verbalized understanding;Returned demonstration          PT Long Term Goals - 08/01/17 0809      PT LONG TERM GOAL #1   Title  Patient will be independent with HEP.    Time  6    Period  Weeks    Status  On-going      PT LONG TERM GOAL #2   Title  Patient will improve shoulder/scapular strength to 5/5 to improve stability during functional activities    Time  6    Period  Weeks    Status  On-going      PT LONG TERM GOAL #3   Title  Patient will improve lumbar flexion finger tip to floor to less than 5 inches with no reports of "pulling" in lumbar spine    Time  6    Period  Weeks    Status  On-going      PT LONG TERM GOAL #4   Title  Patient will improve FOTO score to less than 20% limited.    Time  6    Period  Weeks    Status  On-going            Plan - 08/19/17 0802    Clinical Impression Statement  Patient tolerated treatment well today and was able to progress  upperback/posture strengthening exercises today. Patient has ongoing discomfort 3-4/10 at worst with prolong seated position, yet feels 30% improvement overall. Patient reported a prolong seated car ride which caused some discomfort yet not as bad as prior to therapy.  Patient progressing toward goals with ongoing pain limitations.     Rehab Potential  Excellent    PT Frequency  2x / week    PT Duration  6 weeks    PT Treatment/Interventions  ADLs/Self Care Home Management;Electrical Stimulation;Ultrasound;Moist Heat;Dry needling;Passive range of motion;Manual techniques;Therapeutic exercise;Therapeutic activities;Taping;Cryotherapy    PT Next Visit Plan  cont with POC for postural exercises in standing and prone/STW/ Modalites PRN for pain relief    Consulted and Agree with Plan of Care  Patient       Patient will benefit from skilled therapeutic intervention in order to improve the following deficits and impairments:  Hypomobility, Decreased strength, Pain, Postural dysfunction  Visit Diagnosis: Pain in thoracic spine  Abnormal posture     Problem List There are no active problems to display for this patient.   Phillips Climes, PTA 08/19/2017, 8:28 AM  [facility] [place], [place], [address] Phone: [phone]   Fax:  [phone]  Name: Linda Goodwin MRN: [id number] Date of Birth: 27-Sep-1950

## 2017-08-23 ENCOUNTER — Encounter: Payer: Medicare HMO | Admitting: Physical Therapy

## 2017-08-27 ENCOUNTER — Encounter: Payer: Self-pay | Admitting: Physical Therapy

## 2017-08-27 ENCOUNTER — Ambulatory Visit: Payer: Medicare HMO | Admitting: Physical Therapy

## 2017-08-27 DIAGNOSIS — M546 Pain in thoracic spine: Secondary | ICD-10-CM | POA: Diagnosis not present

## 2017-08-27 DIAGNOSIS — R293 Abnormal posture: Secondary | ICD-10-CM | POA: Diagnosis not present

## 2017-08-27 NOTE — Therapy (Signed)
[facility] [place], [place], [address] Phone: [phone]   Fax:  [phone]  Physical Therapy Treatment  Patient Details  Name: Linda Goodwin MRN: [id number] Date of Birth: 18-Dec-1950 Referring Provider: Phylliss Bob, Md   Encounter Date: 08/27/2017  PT End of Session - 08/27/17 0742    Visit Number  8    Number of Visits  12    Date for PT Re-Evaluation  09/10/17    Authorization Type  FOTO every 5th visit      5th visit taken 31%    PT Start Time  0740    PT Stop Time  0822    PT Time Calculation (min)  42 min    Activity Tolerance  Patient tolerated treatment well    Behavior During Therapy  [facility] for tasks assessed/performed       History reviewed. No pertinent past medical history.  History reviewed. No pertinent surgical history.  There were no vitals filed for this visit.  Subjective Assessment - 08/27/17 0741    Subjective  Reports that she worked out in her yard all this weekend and that may be cause of a little back discomfort.    Pertinent History  HTN    Limitations  House hold activities;Lifting    Diagnostic tests  MRI- old fracture and mild arthritic changes    Patient Stated Goals  get rid of pain and increase activity levels    Currently in Pain?  Other (Comment) No pain assessment provided by patient         [facility] PT Assessment - 08/27/17 0001      Assessment   Medical Diagnosis  Thoracic pain    Onset Date/Surgical Date  01/30/17    Next MD Visit  None    Prior Therapy  no                   [facility] Adult PT Treatment/Exercise - 08/27/17 0001      Shoulder Exercises: Seated   Horizontal ABduction  Strengthening;Both;20 reps;Theraband    Theraband Level (Shoulder Horizontal ABduction)  Level 2 (Red)    External Rotation  Strengthening;Both;20 reps;Theraband    Theraband Level (Shoulder External Rotation)  Level 2 (Red)    Diagonals  Strengthening;Both;20 reps;Theraband    Theraband Level (Shoulder Diagonals)  Level 2 (Red)      Shoulder Exercises: Prone   Horizontal ABduction 1  Strengthening;Both;20 reps;Weights    Horizontal ABduction 1 Weight (lbs)  4    Other Prone Exercises  B prone scaption 4# x20 reps      Shoulder Exercises: ROM/Strengthening   UBE (Upper Arm Bike)  60 RPM x6 min (3 m forward, 3 m backward)    Lat Pull  Limitations    Lat Pull Limitations  Pink XTS x20 reps    Cybex Row  3 plate 3x10 reps    Wall Wash  BUE CW/CCW x20 reps with red theraball      Modalities   Modalities  Electrical Stimulation      Electrical Stimulation   Electrical Stimulation Location  B thoracic paraspinals    Electrical Stimulation Action  IFC    Electrical Stimulation Parameters  80-150 hz x15 min    Electrical Stimulation Goals  Pain                  PT Long Term Goals - 08/01/17 0809      PT LONG TERM GOAL #1  Title  Patient will be independent with HEP.    Time  6    Period  Weeks    Status  On-going      PT LONG TERM GOAL #2   Title  Patient will improve shoulder/scapular strength to 5/5 to improve stability during functional activities    Time  6    Period  Weeks    Status  On-going      PT LONG TERM GOAL #3   Title  Patient will improve lumbar flexion finger tip to floor to less than 5 inches with no reports of "pulling" in lumbar spine    Time  6    Period  Weeks    Status  On-going      PT LONG TERM GOAL #4   Title  Patient will improve FOTO score to less than 20% limited.    Time  6    Period  Weeks    Status  On-going            Plan - 08/27/17 0813    Clinical Impression Statement  Patient tolerated today's treatment well with only reports of discomfort due to yardwork throughout the weekend. No back pain reported by patient during the treatment today. LUE weakness noted but patient is right handed. Normal modalities response noted following removal of the modalities.    Rehab Potential  Excellent    PT  Frequency  2x / week    PT Duration  6 weeks    PT Treatment/Interventions  ADLs/Self Care Home Management;Electrical Stimulation;Ultrasound;Moist Heat;Dry needling;Passive range of motion;Manual techniques;Therapeutic exercise;Therapeutic activities;Taping;Cryotherapy    PT Next Visit Plan  cont with POC for postural exercises in standing and prone/STW/ Modalites PRN for pain relief    PT Home Exercise Plan  Scapular retractions, shoulder rolls with emphasis on scap rectractions, and corner stretch    Consulted and Agree with Plan of Care  Patient       Patient will benefit from skilled therapeutic intervention in order to improve the following deficits and impairments:  Hypomobility, Decreased strength, Pain, Postural dysfunction  Visit Diagnosis: Pain in thoracic spine  Abnormal posture     Problem List There are no active problems to display for this patient.   Standley Brooking, PTA 08/27/2017, 8:37 AM  [facility] [address] [place], [place], [address] Phone: [phone]   Fax:  [phone]  Name: Linda Goodwin MRN: [id number] Date of Birth: 06-06-1950

## 2017-08-30 ENCOUNTER — Ambulatory Visit: Payer: Medicare HMO | Admitting: Physical Therapy

## 2017-08-30 ENCOUNTER — Encounter: Payer: Self-pay | Admitting: Physical Therapy

## 2017-08-30 DIAGNOSIS — M546 Pain in thoracic spine: Secondary | ICD-10-CM | POA: Diagnosis not present

## 2017-08-30 DIAGNOSIS — R293 Abnormal posture: Secondary | ICD-10-CM | POA: Diagnosis not present

## 2017-08-30 NOTE — Therapy (Addendum)
[facility] [place], [place], [address] Phone: [phone]   Fax:  [phone]  Physical Therapy Treatment/Discharge  Patient Details  Name: Linda Goodwin MRN: [id number] Date of Birth: 06-Jan-1951 Referring Provider: Phylliss Bob, Md   Encounter Date: 08/30/2017  PT End of Session - 08/30/17 0734    Visit Number  9    Number of Visits  12    Date for PT Re-Evaluation  09/10/17    Authorization Type  FOTO every 5th visit      5th visit taken 31%    PT Start Time  0733    PT Stop Time  0819    PT Time Calculation (min)  46 min    Activity Tolerance  Patient tolerated treatment well    Behavior During Therapy  [facility] for tasks assessed/performed       History reviewed. No pertinent past medical history.  History reviewed. No pertinent surgical history.  There were no vitals filed for this visit.  Subjective Assessment - 08/30/17 0733    Subjective  Reports that she has very little pain in her back.    Pertinent History  HTN    Limitations  House hold activities;Lifting    Diagnostic tests  MRI- old fracture and mild arthritic changes    Patient Stated Goals  get rid of pain and increase activity levels    Currently in Pain?  No/denies         [facility] PT Assessment - 08/30/17 0001      Assessment   Medical Diagnosis  Thoracic pain    Onset Date/Surgical Date  01/30/17    Next MD Visit  None    Prior Therapy  no                   [facility] Adult PT Treatment/Exercise - 08/30/17 0001      Shoulder Exercises: Prone   Retraction  Strengthening;Both;20 reps;Weights    Retraction Weight (lbs)  4    Extension  Strengthening;Both;20 reps;Weights    Extension Weight (lbs)  4    Horizontal ABduction 1  Strengthening;Both;20 reps;Weights    Horizontal ABduction 1 Weight (lbs)  4      Shoulder Exercises: Standing   Horizontal ABduction  Strengthening;Both;20 reps;Theraband    Theraband Level (Shoulder  Horizontal ABduction)  Level 2 (Red)    External Rotation  Strengthening;Both;20 reps;Theraband    Theraband Level (Shoulder External Rotation)  Level 2 (Red)    Diagonals  Strengthening;Both;20 reps;Theraband    Theraband Level (Shoulder Diagonals)  Level 2 (Red)      Shoulder Exercises: ROM/Strengthening   UBE (Upper Arm Bike)  60 RPM x8 min (4 m forward, 4 m backward)    Lat Pull  3 plate;20 reps    Cybex Row  3 plate;20 reps    Wall Wash  BUE CW/CCW x20 reps red theraband    Wall Pushups  20 reps      Modalities   Modalities  Electrical Stimulation;Moist Heat      Moist Heat Therapy   Number Minutes Moist Heat  15 Minutes    Moist Heat Location  Other (comment) Thoracic spine      Electrical Stimulation   Electrical Stimulation Location  B thoracic paraspinals    Electrical Stimulation Action  IFC    Electrical Stimulation Parameters  1-10 hz x15 min    Electrical Stimulation Goals  Pain  PT Long Term Goals - 08/01/17 0809      PT LONG TERM GOAL #1   Title  Patient will be independent with HEP.    Time  6    Period  Weeks    Status  On-going      PT LONG TERM GOAL #2   Title  Patient will improve shoulder/scapular strength to 5/5 to improve stability during functional activities    Time  6    Period  Weeks    Status  On-going      PT LONG TERM GOAL #3   Title  Patient will improve lumbar flexion finger tip to floor to less than 5 inches with no reports of "pulling" in lumbar spine    Time  6    Period  Weeks    Status  On-going      PT LONG TERM GOAL #4   Title  Patient will improve FOTO score to less than 20% limited.    Time  6    Period  Weeks    Status  On-going            Plan - 08/30/17 0813    Clinical Impression Statement  Patient tolerated today's treatment well with no to mild mid back pain upon arrival today. Patient able to complete all postural strengthening exercises without complaint of pain. Patient does not  really experience pain at work as she uses lumbar roll but does report pain with yardwork activities. Normal modalities response noted following removal of the modalities.    Rehab Potential  Excellent    PT Frequency  2x / week    PT Duration  6 weeks    PT Treatment/Interventions  ADLs/Self Care Home Management;Electrical Stimulation;Ultrasound;Moist Heat;Dry needling;Passive range of motion;Manual techniques;Therapeutic exercise;Therapeutic activities;Taping;Cryotherapy    PT Next Visit Plan  cont with POC for postural exercises in standing and prone/STW/ Modalites PRN for pain relief    PT Home Exercise Plan  Scapular retractions, shoulder rolls with emphasis on scap rectractions, and corner stretch    Consulted and Agree with Plan of Care  Patient       Patient will benefit from skilled therapeutic intervention in order to improve the following deficits and impairments:  Hypomobility, Decreased strength, Pain, Postural dysfunction  Visit Diagnosis: Pain in thoracic spine  Abnormal posture     Problem List There are no active problems to display for this patient.  PHYSICAL THERAPY DISCHARGE SUMMARY  Visits from Start of Care: 9  Current functional level related to goals / functional outcomes: See above   Remaining deficits: See goals   Education / Equipment: HEP  Plan: Patient agrees to discharge.  Patient goals were not met. Patient is being discharged due to not returning since the last visit.  ?????    Gabriela Eves, PT, DPT   Standley Brooking, PTA 08/30/2017, 8:21 AM  [facility] [address] [place], [place], [address] Phone: [phone]   Fax:  [phone]  Name: Linda Goodwin MRN: [id number] Date of Birth: 06-12-1950

## 2017-09-19 DIAGNOSIS — E0500 Thyrotoxicosis with diffuse goiter without thyrotoxic crisis or storm: Secondary | ICD-10-CM | POA: Diagnosis not present

## 2017-10-08 DIAGNOSIS — R300 Dysuria: Secondary | ICD-10-CM | POA: Diagnosis not present

## 2017-10-27 DIAGNOSIS — B3789 Other sites of candidiasis: Secondary | ICD-10-CM | POA: Diagnosis not present

## 2017-10-29 DIAGNOSIS — Z131 Encounter for screening for diabetes mellitus: Secondary | ICD-10-CM | POA: Diagnosis not present

## 2017-10-29 DIAGNOSIS — Z1389 Encounter for screening for other disorder: Secondary | ICD-10-CM | POA: Diagnosis not present

## 2017-10-29 DIAGNOSIS — Z1231 Encounter for screening mammogram for malignant neoplasm of breast: Secondary | ICD-10-CM | POA: Diagnosis not present

## 2017-10-29 DIAGNOSIS — Z13220 Encounter for screening for lipoid disorders: Secondary | ICD-10-CM | POA: Diagnosis not present

## 2017-10-29 DIAGNOSIS — Z1329 Encounter for screening for other suspected endocrine disorder: Secondary | ICD-10-CM | POA: Diagnosis not present

## 2017-10-29 DIAGNOSIS — Z01419 Encounter for gynecological examination (general) (routine) without abnormal findings: Secondary | ICD-10-CM | POA: Diagnosis not present

## 2017-11-07 DIAGNOSIS — R1013 Epigastric pain: Secondary | ICD-10-CM | POA: Diagnosis not present

## 2017-11-07 DIAGNOSIS — I10 Essential (primary) hypertension: Secondary | ICD-10-CM | POA: Diagnosis not present

## 2017-11-07 DIAGNOSIS — E0590 Thyrotoxicosis, unspecified without thyrotoxic crisis or storm: Secondary | ICD-10-CM | POA: Diagnosis not present

## 2017-11-26 DIAGNOSIS — E0590 Thyrotoxicosis, unspecified without thyrotoxic crisis or storm: Secondary | ICD-10-CM | POA: Diagnosis not present

## 2017-11-26 DIAGNOSIS — Z5181 Encounter for therapeutic drug level monitoring: Secondary | ICD-10-CM | POA: Diagnosis not present

## 2017-11-26 DIAGNOSIS — E0500 Thyrotoxicosis with diffuse goiter without thyrotoxic crisis or storm: Secondary | ICD-10-CM | POA: Diagnosis not present

## 2018-01-10 DIAGNOSIS — H6122 Impacted cerumen, left ear: Secondary | ICD-10-CM | POA: Diagnosis not present

## 2018-01-10 DIAGNOSIS — J3089 Other allergic rhinitis: Secondary | ICD-10-CM | POA: Diagnosis not present

## 2018-01-10 DIAGNOSIS — Z79899 Other long term (current) drug therapy: Secondary | ICD-10-CM | POA: Diagnosis not present

## 2018-01-10 DIAGNOSIS — E0590 Thyrotoxicosis, unspecified without thyrotoxic crisis or storm: Secondary | ICD-10-CM | POA: Diagnosis not present

## 2018-01-10 DIAGNOSIS — E0500 Thyrotoxicosis with diffuse goiter without thyrotoxic crisis or storm: Secondary | ICD-10-CM | POA: Diagnosis not present

## 2018-02-27 DIAGNOSIS — D225 Melanocytic nevi of trunk: Secondary | ICD-10-CM | POA: Diagnosis not present

## 2018-02-27 DIAGNOSIS — D2271 Melanocytic nevi of right lower limb, including hip: Secondary | ICD-10-CM | POA: Diagnosis not present

## 2018-02-27 DIAGNOSIS — Z85828 Personal history of other malignant neoplasm of skin: Secondary | ICD-10-CM | POA: Diagnosis not present

## 2018-02-27 DIAGNOSIS — R69 Illness, unspecified: Secondary | ICD-10-CM | POA: Diagnosis not present

## 2018-02-27 DIAGNOSIS — L821 Other seborrheic keratosis: Secondary | ICD-10-CM | POA: Diagnosis not present

## 2018-02-27 DIAGNOSIS — D2272 Melanocytic nevi of left lower limb, including hip: Secondary | ICD-10-CM | POA: Diagnosis not present

## 2018-02-27 DIAGNOSIS — M7130 Other bursal cyst, unspecified site: Secondary | ICD-10-CM | POA: Diagnosis not present

## 2018-03-05 DIAGNOSIS — E0500 Thyrotoxicosis with diffuse goiter without thyrotoxic crisis or storm: Secondary | ICD-10-CM | POA: Diagnosis not present

## 2018-03-05 DIAGNOSIS — E0590 Thyrotoxicosis, unspecified without thyrotoxic crisis or storm: Secondary | ICD-10-CM | POA: Diagnosis not present

## 2018-03-05 DIAGNOSIS — Z5181 Encounter for therapeutic drug level monitoring: Secondary | ICD-10-CM | POA: Diagnosis not present

## 2018-04-14 DIAGNOSIS — H9201 Otalgia, right ear: Secondary | ICD-10-CM | POA: Diagnosis not present

## 2018-04-14 DIAGNOSIS — H6121 Impacted cerumen, right ear: Secondary | ICD-10-CM | POA: Diagnosis not present

## 2018-04-14 DIAGNOSIS — E0500 Thyrotoxicosis with diffuse goiter without thyrotoxic crisis or storm: Secondary | ICD-10-CM | POA: Diagnosis not present

## 2018-04-14 DIAGNOSIS — H9311 Tinnitus, right ear: Secondary | ICD-10-CM | POA: Diagnosis not present

## 2018-05-05 DIAGNOSIS — H6123 Impacted cerumen, bilateral: Secondary | ICD-10-CM | POA: Insufficient documentation

## 2018-05-05 DIAGNOSIS — H6121 Impacted cerumen, right ear: Secondary | ICD-10-CM | POA: Diagnosis not present

## 2018-05-05 DIAGNOSIS — H6993 Unspecified Eustachian tube disorder, bilateral: Secondary | ICD-10-CM | POA: Insufficient documentation

## 2018-05-05 DIAGNOSIS — H6981 Other specified disorders of Eustachian tube, right ear: Secondary | ICD-10-CM | POA: Diagnosis not present

## 2018-06-10 DIAGNOSIS — M546 Pain in thoracic spine: Secondary | ICD-10-CM | POA: Diagnosis not present

## 2018-06-10 DIAGNOSIS — I10 Essential (primary) hypertension: Secondary | ICD-10-CM | POA: Diagnosis not present

## 2018-06-10 DIAGNOSIS — E0500 Thyrotoxicosis with diffuse goiter without thyrotoxic crisis or storm: Secondary | ICD-10-CM | POA: Diagnosis not present

## 2018-06-10 DIAGNOSIS — E0590 Thyrotoxicosis, unspecified without thyrotoxic crisis or storm: Secondary | ICD-10-CM | POA: Diagnosis not present

## 2018-07-26 IMAGING — MR MR LUMBAR SPINE W/O CM
6 of 7 series · 43 of 48 positions shown · non-contrast
Comparison: None.

CLINICAL DATA: Mid back pain for 6 months.

EXAM:
MRI THORACIC AND LUMBAR SPINE WITHOUT CONTRAST
TECHNIQUE: Multiplanar and multiecho pulse sequences of the thoracic and lumbar
spine were obtained without intravenous contrast.

[Series 1: T1 · sagittal · 4.0mm · 0.81mm/px · 6 of 14 slices shown (1 of 3)]
[im 1/14]
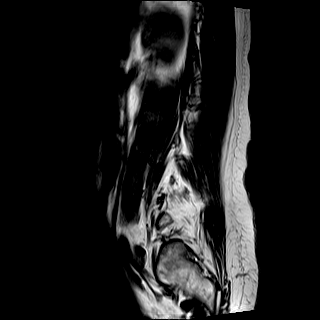
[im 3/14]
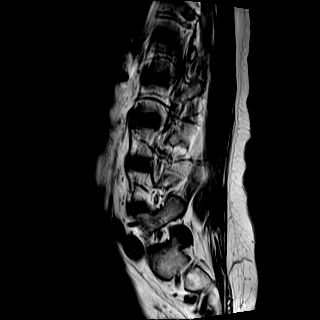
[im 6/14]
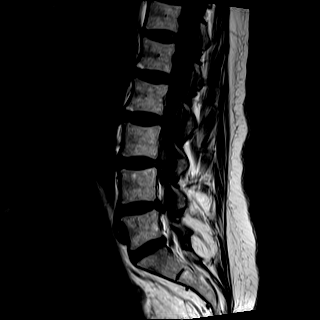
[im 8/14]
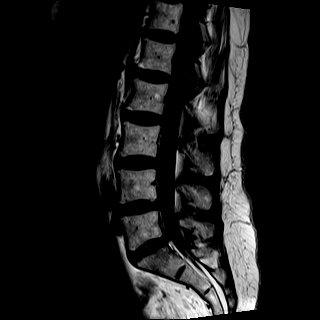
[im 11/14]
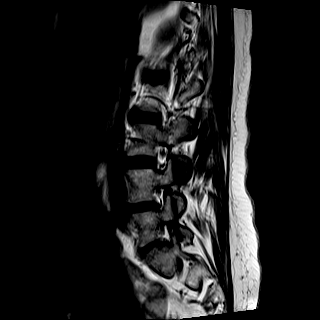
[im 14/14]
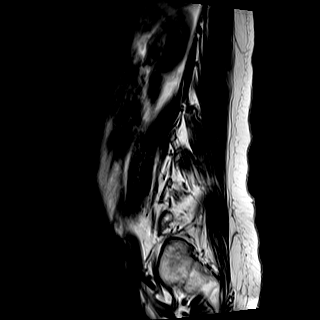

[Series 2: T2 · sagittal · 4.0mm · 0.81mm/px · 5 of 14 slices shown (1 of 3)]
[im 1/14]
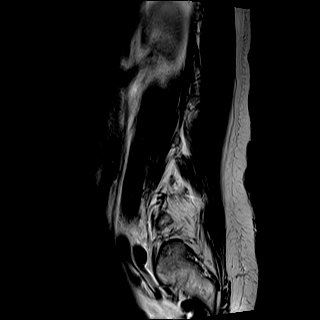
[im 4/14]
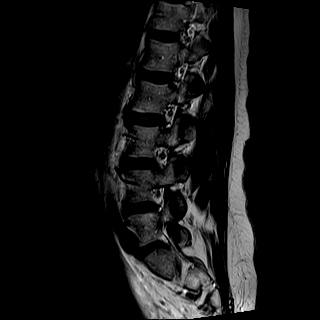
[im 7/14]
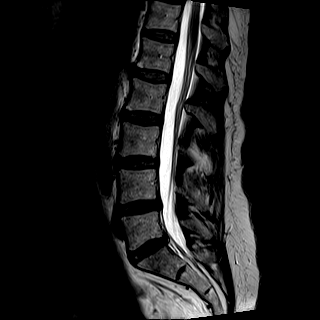
[im 10/14]
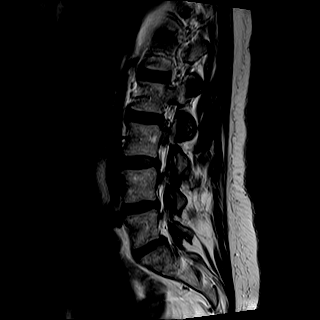
[im 14/14]
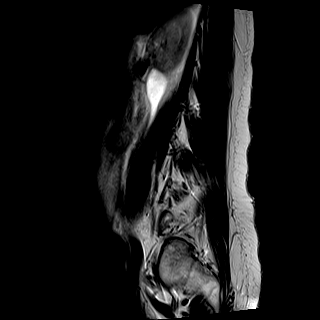

[Series 4: T2 · axial · 4.0mm · 0.62mm/px · z∈[-405,-301]mm · 8 of 22 slices shown (2 of 3)]
[im 1/22]
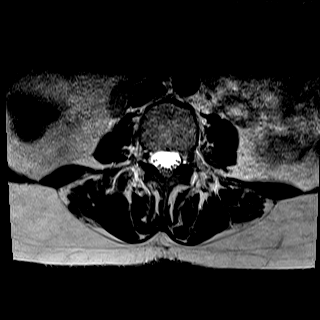
[im 4/22]
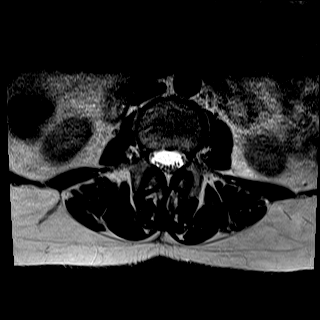
[im 7/22]
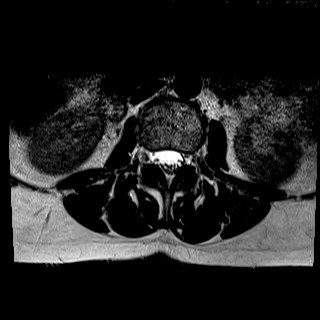
[im 10/22]
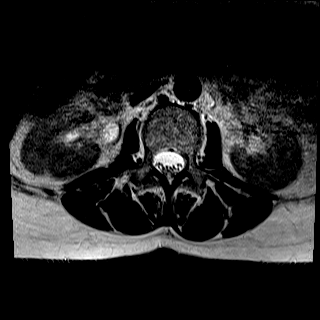
[im 13/22]
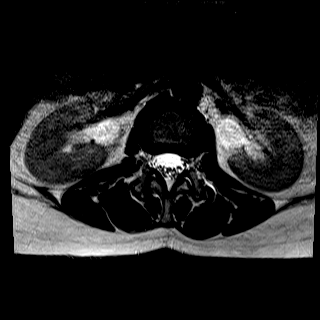
[im 16/22]
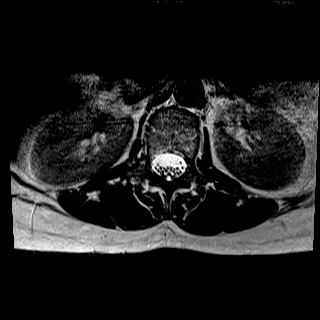
[im 19/22]
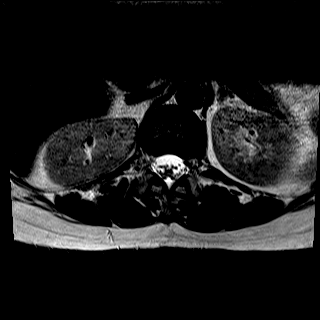
[im 22/22]
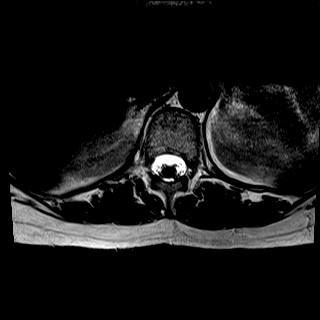

[Series 5: T2 · axial · 4.0mm · 0.62mm/px · z∈[-532,-428]mm · 8 of 22 slices shown (3 of 3)]
[im 1/22]
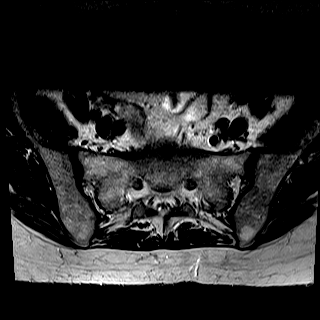
[im 4/22]
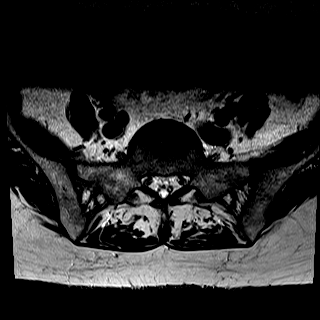
[im 7/22]
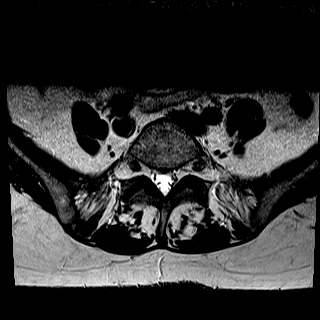
[im 10/22]
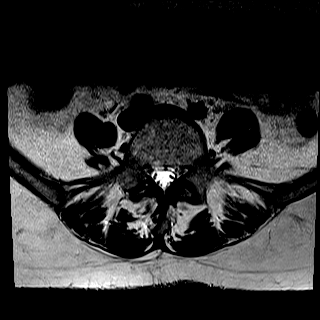
[im 13/22]
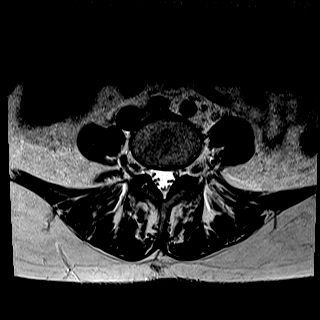
[im 16/22]
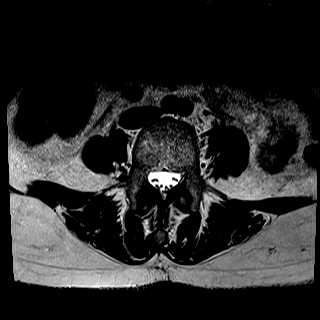
[im 19/22]
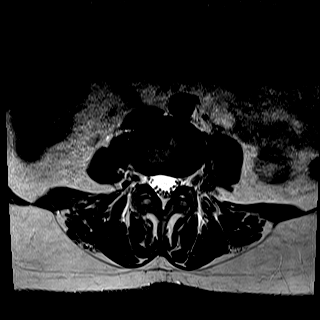
[im 22/22]
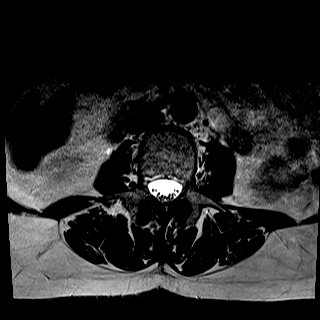

[Series 6: T1 · axial · 4.0mm · 0.62mm/px · z∈[-405,-301]mm · 8 of 22 slices shown (2 of 3)]
[im 1/22]
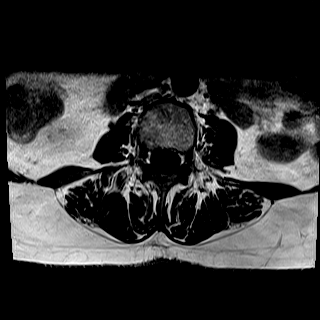
[im 4/22]
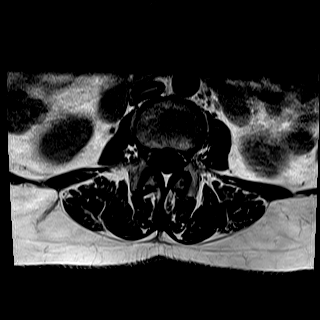
[im 7/22]
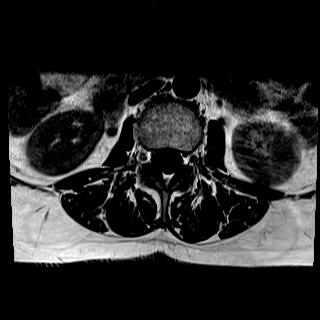
[im 10/22]
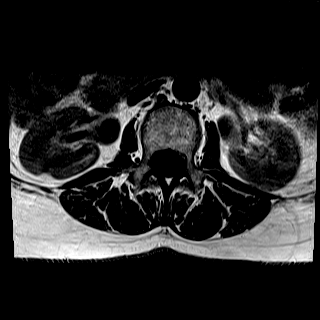
[im 13/22]
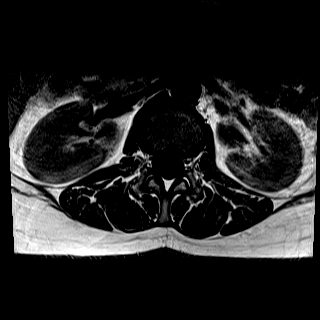
[im 16/22]
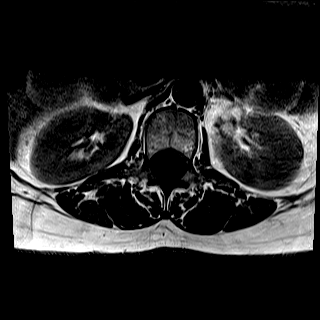
[im 19/22]
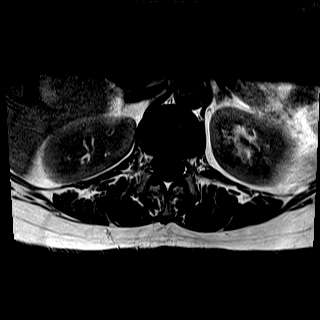
[im 22/22]
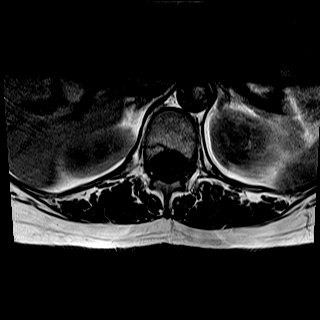

[Series 7: T1 · axial · 4.0mm · 0.62mm/px · z∈[-532,-428]mm · 8 of 22 slices shown (3 of 3)]
[im 1/22]
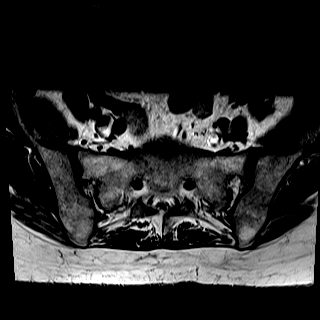
[im 4/22]
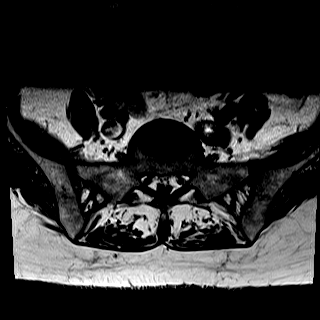
[im 7/22]
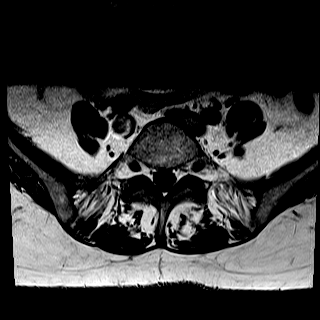
[im 10/22]
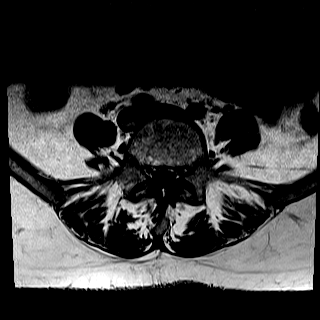
[im 13/22]
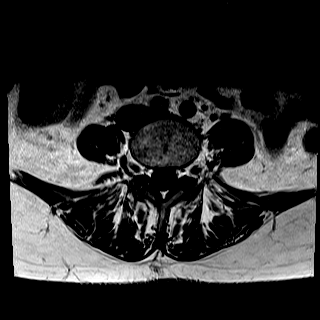
[im 16/22]
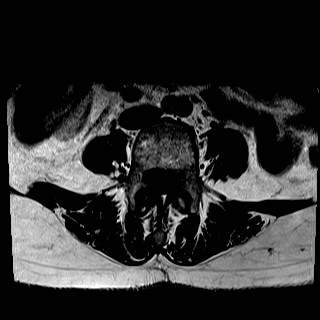
[im 19/22]
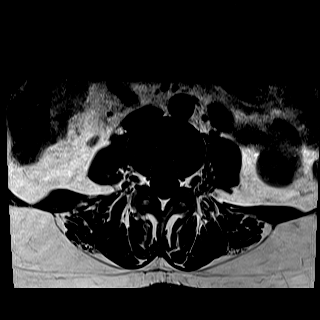
[im 22/22]
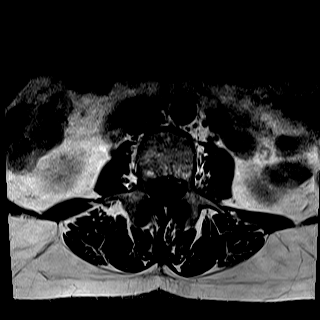

[43 of 48 positions shown; findings below may reference images not displayed]

FINDINGS: MRI THORACIC SPINE FINDINGS

ALIGNMENT: Maintenance of the thoracic kyphosis. No malalignment.

VERTEBRAE/DISCS: Moderate old T5 compression fracture with 30-50%
height loss. Mild acute T4-5 discogenic endplate changes.
Intervertebral disc morphology generally preserved. No suspicious
bone marrow signal.

CORD: Thoracic spinal cord is normal morphology and signal
characteristics.

PREVERTEBRAL AND PARASPINAL SOFT TISSUES:

DISC LEVELS (Mild motion degraded axial sequences:

No disc bulge, canal stenosis nor neural foraminal narrowing.

MRI LUMBAR SPINE FINDINGS

SEGMENTATION: For the purposes of this report, the last well-formed
intervertebral disc is reported as L5-S1.

ALIGNMENT: Maintained lumbar lordosis. No malalignment.

VERTEBRAE:Vertebral bodies are intact. Minimal L4-5 and mild L5-S1
disc height loss with mild discogenic endplate changes. Multilevel
mild subacute on chronic discogenic endplate changes. No acute or
abnormal bone marrow signal.

CONUS MEDULLARIS AND CAUDA EQUINA: Conus medullaris terminates at
T12-L1 and demonstrates normal morphology and signal
characteristics. Cauda equina is normal.

PARASPINAL AND OTHER SOFT TISSUES: Included prevertebral and
paraspinal soft tissues are normal.

DISC LEVELS (axial sequences are mildly motion degraded):

T12-L1 through L3-4: No disc bulge, canal stenosis nor neural
foraminal narrowing.

L4-5: Small broad-based disc bulge, minimal facet arthropathy and
ligamentum flavum redundancy without canal stenosis or neural
foraminal narrowing. Central annular fissure.

L5-S1: Small broad-based disc bulge and central annular fissure.
Minimal facet arthropathy without canal stenosis or neural foraminal
narrowing.
IMPRESSION: MR THORACIC SPINE IMPRESSION

1. Moderate old T5 compression fracture. Acute mild T4-5 discogenic
endplate changes.
2. No canal stenosis or neural foraminal narrowing. No advanced
degenerative change for age.

MR LUMBAR SPINE IMPRESSION

1. Degenerative change of lumbar spine without fracture or
malalignment. No acute osseous process.
2. No canal stenosis or neural foraminal narrowing. L4-5 and L5-S1
annular fissures.

## 2018-07-30 DIAGNOSIS — E0590 Thyrotoxicosis, unspecified without thyrotoxic crisis or storm: Secondary | ICD-10-CM | POA: Diagnosis not present

## 2018-07-30 DIAGNOSIS — E0500 Thyrotoxicosis with diffuse goiter without thyrotoxic crisis or storm: Secondary | ICD-10-CM | POA: Diagnosis not present

## 2018-09-09 DIAGNOSIS — E0590 Thyrotoxicosis, unspecified without thyrotoxic crisis or storm: Secondary | ICD-10-CM | POA: Diagnosis not present

## 2018-09-09 DIAGNOSIS — E0500 Thyrotoxicosis with diffuse goiter without thyrotoxic crisis or storm: Secondary | ICD-10-CM | POA: Diagnosis not present

## 2018-09-09 DIAGNOSIS — Z79899 Other long term (current) drug therapy: Secondary | ICD-10-CM | POA: Diagnosis not present

## 2018-10-31 DIAGNOSIS — E0500 Thyrotoxicosis with diffuse goiter without thyrotoxic crisis or storm: Secondary | ICD-10-CM | POA: Diagnosis not present

## 2018-10-31 DIAGNOSIS — Z5181 Encounter for therapeutic drug level monitoring: Secondary | ICD-10-CM | POA: Diagnosis not present

## 2018-10-31 DIAGNOSIS — E0590 Thyrotoxicosis, unspecified without thyrotoxic crisis or storm: Secondary | ICD-10-CM | POA: Diagnosis not present

## 2018-12-09 DIAGNOSIS — Z0000 Encounter for general adult medical examination without abnormal findings: Secondary | ICD-10-CM | POA: Diagnosis not present

## 2018-12-09 DIAGNOSIS — I10 Essential (primary) hypertension: Secondary | ICD-10-CM | POA: Diagnosis not present

## 2018-12-09 DIAGNOSIS — Z5181 Encounter for therapeutic drug level monitoring: Secondary | ICD-10-CM | POA: Diagnosis not present

## 2018-12-09 DIAGNOSIS — Z136 Encounter for screening for cardiovascular disorders: Secondary | ICD-10-CM | POA: Diagnosis not present

## 2018-12-09 DIAGNOSIS — Z13820 Encounter for screening for osteoporosis: Secondary | ICD-10-CM | POA: Diagnosis not present

## 2018-12-09 DIAGNOSIS — Z131 Encounter for screening for diabetes mellitus: Secondary | ICD-10-CM | POA: Diagnosis not present

## 2018-12-09 DIAGNOSIS — E0590 Thyrotoxicosis, unspecified without thyrotoxic crisis or storm: Secondary | ICD-10-CM | POA: Diagnosis not present

## 2018-12-16 DIAGNOSIS — R69 Illness, unspecified: Secondary | ICD-10-CM | POA: Diagnosis not present

## 2018-12-30 DIAGNOSIS — E0590 Thyrotoxicosis, unspecified without thyrotoxic crisis or storm: Secondary | ICD-10-CM | POA: Diagnosis not present

## 2018-12-30 DIAGNOSIS — E0500 Thyrotoxicosis with diffuse goiter without thyrotoxic crisis or storm: Secondary | ICD-10-CM | POA: Diagnosis not present

## 2018-12-30 DIAGNOSIS — Z5181 Encounter for therapeutic drug level monitoring: Secondary | ICD-10-CM | POA: Diagnosis not present

## 2018-12-31 DIAGNOSIS — Z1231 Encounter for screening mammogram for malignant neoplasm of breast: Secondary | ICD-10-CM | POA: Diagnosis not present

## 2019-03-26 DIAGNOSIS — E0500 Thyrotoxicosis with diffuse goiter without thyrotoxic crisis or storm: Secondary | ICD-10-CM | POA: Diagnosis not present

## 2019-03-26 DIAGNOSIS — Z5181 Encounter for therapeutic drug level monitoring: Secondary | ICD-10-CM | POA: Diagnosis not present

## 2019-03-26 DIAGNOSIS — E0590 Thyrotoxicosis, unspecified without thyrotoxic crisis or storm: Secondary | ICD-10-CM | POA: Diagnosis not present

## 2019-03-26 DIAGNOSIS — Z23 Encounter for immunization: Secondary | ICD-10-CM | POA: Diagnosis not present

## 2019-05-06 DIAGNOSIS — Z5181 Encounter for therapeutic drug level monitoring: Secondary | ICD-10-CM | POA: Diagnosis not present

## 2019-05-06 DIAGNOSIS — E0500 Thyrotoxicosis with diffuse goiter without thyrotoxic crisis or storm: Secondary | ICD-10-CM | POA: Diagnosis not present

## 2019-05-06 DIAGNOSIS — E0590 Thyrotoxicosis, unspecified without thyrotoxic crisis or storm: Secondary | ICD-10-CM | POA: Diagnosis not present

## 2019-06-17 DIAGNOSIS — H2513 Age-related nuclear cataract, bilateral: Secondary | ICD-10-CM | POA: Diagnosis not present

## 2019-07-08 DIAGNOSIS — E0500 Thyrotoxicosis with diffuse goiter without thyrotoxic crisis or storm: Secondary | ICD-10-CM | POA: Diagnosis not present

## 2019-07-08 DIAGNOSIS — E0590 Thyrotoxicosis, unspecified without thyrotoxic crisis or storm: Secondary | ICD-10-CM | POA: Diagnosis not present

## 2019-07-08 DIAGNOSIS — Z79899 Other long term (current) drug therapy: Secondary | ICD-10-CM | POA: Diagnosis not present

## 2019-08-27 DIAGNOSIS — E0590 Thyrotoxicosis, unspecified without thyrotoxic crisis or storm: Secondary | ICD-10-CM | POA: Diagnosis not present

## 2019-08-27 DIAGNOSIS — E0500 Thyrotoxicosis with diffuse goiter without thyrotoxic crisis or storm: Secondary | ICD-10-CM | POA: Diagnosis not present

## 2019-08-31 DIAGNOSIS — E0500 Thyrotoxicosis with diffuse goiter without thyrotoxic crisis or storm: Secondary | ICD-10-CM | POA: Diagnosis not present

## 2019-08-31 DIAGNOSIS — E0590 Thyrotoxicosis, unspecified without thyrotoxic crisis or storm: Secondary | ICD-10-CM | POA: Diagnosis not present

## 2019-08-31 DIAGNOSIS — Z5181 Encounter for therapeutic drug level monitoring: Secondary | ICD-10-CM | POA: Diagnosis not present

## 2019-11-02 DIAGNOSIS — Z79899 Other long term (current) drug therapy: Secondary | ICD-10-CM | POA: Diagnosis not present

## 2019-11-02 DIAGNOSIS — E0500 Thyrotoxicosis with diffuse goiter without thyrotoxic crisis or storm: Secondary | ICD-10-CM | POA: Diagnosis not present

## 2019-11-02 DIAGNOSIS — E0590 Thyrotoxicosis, unspecified without thyrotoxic crisis or storm: Secondary | ICD-10-CM | POA: Diagnosis not present

## 2019-11-24 DIAGNOSIS — R21 Rash and other nonspecific skin eruption: Secondary | ICD-10-CM | POA: Diagnosis not present

## 2019-11-30 DIAGNOSIS — L237 Allergic contact dermatitis due to plants, except food: Secondary | ICD-10-CM | POA: Diagnosis not present

## 2020-01-04 DIAGNOSIS — E0500 Thyrotoxicosis with diffuse goiter without thyrotoxic crisis or storm: Secondary | ICD-10-CM | POA: Diagnosis not present

## 2020-01-04 DIAGNOSIS — E0590 Thyrotoxicosis, unspecified without thyrotoxic crisis or storm: Secondary | ICD-10-CM | POA: Diagnosis not present

## 2020-01-14 DIAGNOSIS — R69 Illness, unspecified: Secondary | ICD-10-CM | POA: Diagnosis not present

## 2020-01-20 DIAGNOSIS — Z1231 Encounter for screening mammogram for malignant neoplasm of breast: Secondary | ICD-10-CM | POA: Diagnosis not present

## 2020-01-20 DIAGNOSIS — Z1272 Encounter for screening for malignant neoplasm of vagina: Secondary | ICD-10-CM | POA: Diagnosis not present

## 2020-01-20 DIAGNOSIS — Z01419 Encounter for gynecological examination (general) (routine) without abnormal findings: Secondary | ICD-10-CM | POA: Diagnosis not present

## 2020-01-28 DIAGNOSIS — R69 Illness, unspecified: Secondary | ICD-10-CM | POA: Diagnosis not present

## 2020-02-15 DIAGNOSIS — R69 Illness, unspecified: Secondary | ICD-10-CM | POA: Diagnosis not present

## 2020-03-03 DIAGNOSIS — Z23 Encounter for immunization: Secondary | ICD-10-CM | POA: Diagnosis not present

## 2020-03-03 DIAGNOSIS — I10 Essential (primary) hypertension: Secondary | ICD-10-CM | POA: Diagnosis not present

## 2020-03-03 DIAGNOSIS — Z0000 Encounter for general adult medical examination without abnormal findings: Secondary | ICD-10-CM | POA: Diagnosis not present

## 2020-03-03 DIAGNOSIS — E0500 Thyrotoxicosis with diffuse goiter without thyrotoxic crisis or storm: Secondary | ICD-10-CM | POA: Diagnosis not present

## 2020-05-03 DIAGNOSIS — E0590 Thyrotoxicosis, unspecified without thyrotoxic crisis or storm: Secondary | ICD-10-CM | POA: Diagnosis not present

## 2020-05-03 DIAGNOSIS — E0500 Thyrotoxicosis with diffuse goiter without thyrotoxic crisis or storm: Secondary | ICD-10-CM | POA: Diagnosis not present

## 2020-07-28 DIAGNOSIS — E0590 Thyrotoxicosis, unspecified without thyrotoxic crisis or storm: Secondary | ICD-10-CM | POA: Diagnosis not present

## 2020-08-25 DIAGNOSIS — E0500 Thyrotoxicosis with diffuse goiter without thyrotoxic crisis or storm: Secondary | ICD-10-CM | POA: Diagnosis not present

## 2020-09-22 DIAGNOSIS — D485 Neoplasm of uncertain behavior of skin: Secondary | ICD-10-CM | POA: Diagnosis not present

## 2020-09-22 DIAGNOSIS — C44212 Basal cell carcinoma of skin of right ear and external auricular canal: Secondary | ICD-10-CM | POA: Diagnosis not present

## 2020-09-22 DIAGNOSIS — D225 Melanocytic nevi of trunk: Secondary | ICD-10-CM | POA: Diagnosis not present

## 2020-09-22 DIAGNOSIS — L821 Other seborrheic keratosis: Secondary | ICD-10-CM | POA: Diagnosis not present

## 2020-09-22 DIAGNOSIS — L814 Other melanin hyperpigmentation: Secondary | ICD-10-CM | POA: Diagnosis not present

## 2020-09-22 DIAGNOSIS — Z85828 Personal history of other malignant neoplasm of skin: Secondary | ICD-10-CM | POA: Diagnosis not present

## 2020-10-18 DIAGNOSIS — C44212 Basal cell carcinoma of skin of right ear and external auricular canal: Secondary | ICD-10-CM | POA: Diagnosis not present

## 2020-10-27 DIAGNOSIS — E0590 Thyrotoxicosis, unspecified without thyrotoxic crisis or storm: Secondary | ICD-10-CM | POA: Diagnosis not present

## 2021-01-11 DIAGNOSIS — E0590 Thyrotoxicosis, unspecified without thyrotoxic crisis or storm: Secondary | ICD-10-CM | POA: Diagnosis not present

## 2021-02-03 DIAGNOSIS — Z1231 Encounter for screening mammogram for malignant neoplasm of breast: Secondary | ICD-10-CM | POA: Diagnosis not present

## 2021-02-22 DIAGNOSIS — Z136 Encounter for screening for cardiovascular disorders: Secondary | ICD-10-CM | POA: Diagnosis not present

## 2021-02-22 DIAGNOSIS — E0590 Thyrotoxicosis, unspecified without thyrotoxic crisis or storm: Secondary | ICD-10-CM | POA: Diagnosis not present

## 2021-02-22 DIAGNOSIS — Z131 Encounter for screening for diabetes mellitus: Secondary | ICD-10-CM | POA: Diagnosis not present

## 2021-03-06 DIAGNOSIS — Z0000 Encounter for general adult medical examination without abnormal findings: Secondary | ICD-10-CM | POA: Diagnosis not present

## 2021-03-06 DIAGNOSIS — I10 Essential (primary) hypertension: Secondary | ICD-10-CM | POA: Diagnosis not present

## 2021-03-06 DIAGNOSIS — Z23 Encounter for immunization: Secondary | ICD-10-CM | POA: Diagnosis not present

## 2021-03-06 DIAGNOSIS — Z131 Encounter for screening for diabetes mellitus: Secondary | ICD-10-CM | POA: Diagnosis not present

## 2021-03-06 DIAGNOSIS — Z136 Encounter for screening for cardiovascular disorders: Secondary | ICD-10-CM | POA: Diagnosis not present

## 2021-03-13 DIAGNOSIS — H6123 Impacted cerumen, bilateral: Secondary | ICD-10-CM | POA: Diagnosis not present

## 2021-03-13 DIAGNOSIS — J302 Other seasonal allergic rhinitis: Secondary | ICD-10-CM

## 2021-05-05 DIAGNOSIS — E0590 Thyrotoxicosis, unspecified without thyrotoxic crisis or storm: Secondary | ICD-10-CM | POA: Diagnosis not present

## 2021-05-25 DIAGNOSIS — Z0100 Encounter for examination of eyes and vision without abnormal findings: Secondary | ICD-10-CM | POA: Diagnosis not present

## 2021-05-25 DIAGNOSIS — E7800 Pure hypercholesterolemia, unspecified: Secondary | ICD-10-CM | POA: Diagnosis not present

## 2021-05-25 DIAGNOSIS — H5200 Hypermetropia, unspecified eye: Secondary | ICD-10-CM | POA: Diagnosis not present

## 2021-06-30 DIAGNOSIS — E0590 Thyrotoxicosis, unspecified without thyrotoxic crisis or storm: Secondary | ICD-10-CM | POA: Diagnosis not present

## 2021-09-07 DIAGNOSIS — E0500 Thyrotoxicosis with diffuse goiter without thyrotoxic crisis or storm: Secondary | ICD-10-CM | POA: Diagnosis not present

## 2021-10-09 DIAGNOSIS — E0500 Thyrotoxicosis with diffuse goiter without thyrotoxic crisis or storm: Secondary | ICD-10-CM | POA: Diagnosis not present

## 2021-10-09 DIAGNOSIS — R202 Paresthesia of skin: Secondary | ICD-10-CM | POA: Diagnosis not present

## 2021-10-09 DIAGNOSIS — I10 Essential (primary) hypertension: Secondary | ICD-10-CM | POA: Diagnosis not present

## 2021-10-10 DIAGNOSIS — R202 Paresthesia of skin: Secondary | ICD-10-CM | POA: Diagnosis not present

## 2021-11-29 DIAGNOSIS — E0590 Thyrotoxicosis, unspecified without thyrotoxic crisis or storm: Secondary | ICD-10-CM | POA: Diagnosis not present

## 2022-01-24 DIAGNOSIS — L237 Allergic contact dermatitis due to plants, except food: Secondary | ICD-10-CM | POA: Diagnosis not present

## 2022-01-24 DIAGNOSIS — Z6825 Body mass index (BMI) 25.0-25.9, adult: Secondary | ICD-10-CM | POA: Diagnosis not present

## 2022-02-07 DIAGNOSIS — Z1231 Encounter for screening mammogram for malignant neoplasm of breast: Secondary | ICD-10-CM | POA: Diagnosis not present

## 2022-02-07 DIAGNOSIS — Z1272 Encounter for screening for malignant neoplasm of vagina: Secondary | ICD-10-CM | POA: Diagnosis not present

## 2022-02-07 DIAGNOSIS — Z01419 Encounter for gynecological examination (general) (routine) without abnormal findings: Secondary | ICD-10-CM | POA: Diagnosis not present

## 2022-02-09 ENCOUNTER — Other Ambulatory Visit: Payer: Self-pay | Admitting: Obstetrics and Gynecology

## 2022-02-09 DIAGNOSIS — R928 Other abnormal and inconclusive findings on diagnostic imaging of breast: Secondary | ICD-10-CM

## 2022-02-13 ENCOUNTER — Ambulatory Visit
Admission: RE | Admit: 2022-02-13 | Discharge: 2022-02-13 | Disposition: A | Discharge status: Home / Self Care | Payer: Medicare HMO | Source: Ambulatory Visit | Attending: Obstetrics and Gynecology | Admitting: Obstetrics and Gynecology

## 2022-02-13 ENCOUNTER — Other Ambulatory Visit: Payer: Self-pay | Admitting: Obstetrics and Gynecology

## 2022-02-13 DIAGNOSIS — R921 Mammographic calcification found on diagnostic imaging of breast: Secondary | ICD-10-CM | POA: Diagnosis not present

## 2022-02-13 DIAGNOSIS — R928 Other abnormal and inconclusive findings on diagnostic imaging of breast: Secondary | ICD-10-CM

## 2022-02-20 ENCOUNTER — Ambulatory Visit
Admission: RE | Admit: 2022-02-20 | Discharge: 2022-02-20 | Disposition: A | Discharge status: Home / Self Care | Payer: Medicare HMO | Source: Ambulatory Visit | Attending: Obstetrics and Gynecology | Admitting: Obstetrics and Gynecology

## 2022-02-20 DIAGNOSIS — R928 Other abnormal and inconclusive findings on diagnostic imaging of breast: Secondary | ICD-10-CM

## 2022-02-20 DIAGNOSIS — R921 Mammographic calcification found on diagnostic imaging of breast: Secondary | ICD-10-CM | POA: Diagnosis not present

## 2022-02-20 DIAGNOSIS — N6489 Other specified disorders of breast: Secondary | ICD-10-CM | POA: Diagnosis not present

## 2022-03-09 DIAGNOSIS — E0500 Thyrotoxicosis with diffuse goiter without thyrotoxic crisis or storm: Secondary | ICD-10-CM | POA: Diagnosis not present

## 2022-03-12 DIAGNOSIS — Z0000 Encounter for general adult medical examination without abnormal findings: Secondary | ICD-10-CM | POA: Diagnosis not present

## 2022-03-12 DIAGNOSIS — Z1211 Encounter for screening for malignant neoplasm of colon: Secondary | ICD-10-CM | POA: Diagnosis not present

## 2022-03-12 DIAGNOSIS — E0500 Thyrotoxicosis with diffuse goiter without thyrotoxic crisis or storm: Secondary | ICD-10-CM | POA: Diagnosis not present

## 2022-03-12 DIAGNOSIS — Z23 Encounter for immunization: Secondary | ICD-10-CM | POA: Diagnosis not present

## 2022-03-12 DIAGNOSIS — J3089 Other allergic rhinitis: Secondary | ICD-10-CM | POA: Diagnosis not present

## 2022-03-12 DIAGNOSIS — Z136 Encounter for screening for cardiovascular disorders: Secondary | ICD-10-CM | POA: Diagnosis not present

## 2022-03-12 DIAGNOSIS — Z1231 Encounter for screening mammogram for malignant neoplasm of breast: Secondary | ICD-10-CM | POA: Diagnosis not present

## 2022-03-12 DIAGNOSIS — I10 Essential (primary) hypertension: Secondary | ICD-10-CM | POA: Diagnosis not present

## 2022-03-12 DIAGNOSIS — Z13820 Encounter for screening for osteoporosis: Secondary | ICD-10-CM | POA: Diagnosis not present

## 2022-03-13 ENCOUNTER — Other Ambulatory Visit (HOSPITAL_BASED_OUTPATIENT_CLINIC_OR_DEPARTMENT_OTHER): Payer: Self-pay | Admitting: Family Medicine

## 2022-03-13 DIAGNOSIS — Z13820 Encounter for screening for osteoporosis: Secondary | ICD-10-CM

## 2022-04-26 ENCOUNTER — Ambulatory Visit (HOSPITAL_BASED_OUTPATIENT_CLINIC_OR_DEPARTMENT_OTHER)
Admission: RE | Admit: 2022-04-26 | Discharge: 2022-04-26 | Disposition: A | Discharge status: Home / Self Care | Payer: Medicare HMO | Source: Ambulatory Visit | Attending: Family Medicine | Admitting: Family Medicine

## 2022-04-26 DIAGNOSIS — E0590 Thyrotoxicosis, unspecified without thyrotoxic crisis or storm: Secondary | ICD-10-CM | POA: Insufficient documentation

## 2022-04-26 DIAGNOSIS — M85851 Other specified disorders of bone density and structure, right thigh: Secondary | ICD-10-CM | POA: Diagnosis not present

## 2022-04-26 DIAGNOSIS — Z13820 Encounter for screening for osteoporosis: Secondary | ICD-10-CM | POA: Insufficient documentation

## 2022-04-30 DIAGNOSIS — M8589 Other specified disorders of bone density and structure, multiple sites: Secondary | ICD-10-CM | POA: Diagnosis not present

## 2022-05-09 DIAGNOSIS — E0500 Thyrotoxicosis with diffuse goiter without thyrotoxic crisis or storm: Secondary | ICD-10-CM | POA: Diagnosis not present

## 2022-05-22 DIAGNOSIS — R300 Dysuria: Secondary | ICD-10-CM | POA: Diagnosis not present

## 2022-06-01 ENCOUNTER — Encounter (HOSPITAL_COMMUNITY): Payer: Self-pay | Admitting: Oncology

## 2022-06-01 ENCOUNTER — Observation Stay (HOSPITAL_BASED_OUTPATIENT_CLINIC_OR_DEPARTMENT_OTHER): Payer: Medicare HMO

## 2022-06-01 ENCOUNTER — Emergency Department (HOSPITAL_COMMUNITY): Payer: Medicare HMO

## 2022-06-01 ENCOUNTER — Telehealth: Payer: Self-pay | Admitting: Plastic Surgery

## 2022-06-01 ENCOUNTER — Observation Stay (HOSPITAL_COMMUNITY)
Admission: EM | Admit: 2022-06-01 | Discharge: 2022-06-02 | Disposition: A | Discharge status: Home / Self Care | Payer: Medicare HMO | Attending: Internal Medicine | Admitting: Internal Medicine

## 2022-06-01 ENCOUNTER — Other Ambulatory Visit: Payer: Self-pay

## 2022-06-01 DIAGNOSIS — R000 Tachycardia, unspecified: Secondary | ICD-10-CM | POA: Diagnosis not present

## 2022-06-01 DIAGNOSIS — K219 Gastro-esophageal reflux disease without esophagitis: Secondary | ICD-10-CM | POA: Diagnosis present

## 2022-06-01 DIAGNOSIS — E876 Hypokalemia: Secondary | ICD-10-CM | POA: Insufficient documentation

## 2022-06-01 DIAGNOSIS — R109 Unspecified abdominal pain: Secondary | ICD-10-CM | POA: Insufficient documentation

## 2022-06-01 DIAGNOSIS — S0240DA Maxillary fracture, left side, initial encounter for closed fracture: Secondary | ICD-10-CM | POA: Diagnosis not present

## 2022-06-01 DIAGNOSIS — R7989 Other specified abnormal findings of blood chemistry: Secondary | ICD-10-CM

## 2022-06-01 DIAGNOSIS — I10 Essential (primary) hypertension: Secondary | ICD-10-CM | POA: Insufficient documentation

## 2022-06-01 DIAGNOSIS — B029 Zoster without complications: Secondary | ICD-10-CM | POA: Insufficient documentation

## 2022-06-01 DIAGNOSIS — W1812XA Fall from or off toilet with subsequent striking against object, initial encounter: Secondary | ICD-10-CM | POA: Diagnosis not present

## 2022-06-01 DIAGNOSIS — R778 Other specified abnormalities of plasma proteins: Secondary | ICD-10-CM | POA: Diagnosis not present

## 2022-06-01 DIAGNOSIS — S0990XA Unspecified injury of head, initial encounter: Secondary | ICD-10-CM | POA: Diagnosis not present

## 2022-06-01 DIAGNOSIS — R197 Diarrhea, unspecified: Secondary | ICD-10-CM | POA: Diagnosis not present

## 2022-06-01 DIAGNOSIS — Z7982 Long term (current) use of aspirin: Secondary | ICD-10-CM | POA: Insufficient documentation

## 2022-06-01 DIAGNOSIS — R55 Syncope and collapse: Secondary | ICD-10-CM | POA: Insufficient documentation

## 2022-06-01 DIAGNOSIS — W1811XA Fall from or off toilet without subsequent striking against object, initial encounter: Secondary | ICD-10-CM | POA: Insufficient documentation

## 2022-06-01 DIAGNOSIS — R2681 Unsteadiness on feet: Secondary | ICD-10-CM | POA: Insufficient documentation

## 2022-06-01 DIAGNOSIS — Z79899 Other long term (current) drug therapy: Secondary | ICD-10-CM | POA: Diagnosis not present

## 2022-06-01 DIAGNOSIS — K529 Noninfective gastroenteritis and colitis, unspecified: Secondary | ICD-10-CM | POA: Diagnosis present

## 2022-06-01 DIAGNOSIS — K6389 Other specified diseases of intestine: Secondary | ICD-10-CM | POA: Diagnosis not present

## 2022-06-01 DIAGNOSIS — E0500 Thyrotoxicosis with diffuse goiter without thyrotoxic crisis or storm: Secondary | ICD-10-CM | POA: Diagnosis present

## 2022-06-01 DIAGNOSIS — S0240FA Zygomatic fracture, left side, initial encounter for closed fracture: Secondary | ICD-10-CM | POA: Insufficient documentation

## 2022-06-01 DIAGNOSIS — U071 COVID-19: Principal | ICD-10-CM | POA: Diagnosis present

## 2022-06-01 DIAGNOSIS — N900 Mild vulvar dysplasia: Secondary | ICD-10-CM | POA: Insufficient documentation

## 2022-06-01 DIAGNOSIS — I214 Non-ST elevation (NSTEMI) myocardial infarction: Secondary | ICD-10-CM | POA: Diagnosis not present

## 2022-06-01 DIAGNOSIS — Z043 Encounter for examination and observation following other accident: Secondary | ICD-10-CM | POA: Diagnosis not present

## 2022-06-01 DIAGNOSIS — S0993XA Unspecified injury of face, initial encounter: Secondary | ICD-10-CM | POA: Diagnosis not present

## 2022-06-01 HISTORY — DX: Essential (primary) hypertension: I10

## 2022-06-01 HISTORY — DX: Disorder of thyroid, unspecified: E07.9

## 2022-06-01 LAB — COMPREHENSIVE METABOLIC PANEL
ALT: 23 U/L (ref 0–44)
AST: 34 U/L (ref 15–41)
Albumin: 4.4 g/dL (ref 3.5–5.0)
Alkaline Phosphatase: 72 U/L (ref 38–126)
Anion gap: 12 (ref 5–15)
BUN: 15 mg/dL (ref 8–23)
CO2: 27 mmol/L (ref 22–32)
Calcium: 9.9 mg/dL (ref 8.9–10.3)
Chloride: 97 mmol/L — ABNORMAL LOW (ref 98–111)
Creatinine, Ser: 0.75 mg/dL (ref 0.44–1.00)
GFR, Estimated: >60 mL/min (ref >60)
Glucose, Bld: 130 mg/dL — ABNORMAL HIGH (ref 70–99)
Potassium: 3.3 mmol/L — ABNORMAL LOW (ref 3.5–5.1)
Sodium: 136 mmol/L (ref 135–145)
Total Bilirubin: 0.6 mg/dL (ref 0.3–1.2)
Total Protein: 7.3 g/dL (ref 6.5–8.1)

## 2022-06-01 LAB — CBC WITH DIFFERENTIAL/PLATELET
Abs Immature Granulocytes: 0.07 10*3/uL (ref 0.00–0.07)
Basophils Absolute: 0 10*3/uL (ref 0.0–0.1)
Basophils Relative: 0 %
Eosinophils Absolute: 0 10*3/uL (ref 0.0–0.5)
Eosinophils Relative: 0 %
HCT: 45.3 % (ref 36.0–46.0)
Hemoglobin: 15.4 g/dL — ABNORMAL HIGH (ref 12.0–15.0)
Immature Granulocytes: 1 %
Lymphocytes Relative: 6 %
Lymphs Abs: 0.8 10*3/uL (ref 0.7–4.0)
MCH: 31.6 pg (ref 26.0–34.0)
MCHC: 34 g/dL (ref 30.0–36.0)
MCV: 92.8 fL (ref 80.0–100.0)
Monocytes Absolute: 0.6 10*3/uL (ref 0.1–1.0)
Monocytes Relative: 4 %
Neutro Abs: 13.3 10*3/uL — ABNORMAL HIGH (ref 1.7–7.7)
Neutrophils Relative %: 89 %
Platelets: 278 10*3/uL (ref 150–400)
RBC: 4.88 MIL/uL (ref 3.87–5.11)
RDW: 11.4 % — ABNORMAL LOW (ref 11.5–15.5)
WBC: 14.9 10*3/uL — ABNORMAL HIGH (ref 4.0–10.5)
nRBC: 0 % (ref 0.0–0.2)

## 2022-06-01 LAB — ECHOCARDIOGRAM COMPLETE
AR max vel: 1.87 cm2
AV Area VTI: 1.94 cm2
AV Area mean vel: 1.96 cm2
AV Mean grad: 6 mmHg
AV Peak grad: 10.1 mmHg
Ao pk vel: 1.59 m/s
Area-P 1/2: 3.65 cm2
Height: 62 in
MV M vel: 2.97 m/s
MV Peak grad: 35.3 mmHg
P 1/2 time: 516 msec
S' Lateral: 2.6 cm
Weight: 2240 oz

## 2022-06-01 LAB — TROPONIN I (HIGH SENSITIVITY)
Troponin I (High Sensitivity): 142 ng/L (ref <18)
Troponin I (High Sensitivity): 202 ng/L (ref <18)
Troponin I (High Sensitivity): 47 ng/L — ABNORMAL HIGH (ref <18)

## 2022-06-01 LAB — PHOSPHORUS: Phosphorus: 3.1 mg/dL (ref 2.5–4.6)

## 2022-06-01 LAB — RESP PANEL BY RT-PCR (RSV, FLU A&B, COVID)  RVPGX2
Influenza A by PCR: NEGATIVE
Influenza B by PCR: NEGATIVE
Resp Syncytial Virus by PCR: NEGATIVE
SARS Coronavirus 2 by RT PCR: POSITIVE — AB

## 2022-06-01 LAB — CK: Total CK: 51 U/L (ref 38–234)

## 2022-06-01 LAB — LIPASE, BLOOD: Lipase: 42 U/L (ref 11–51)

## 2022-06-01 LAB — MAGNESIUM: Magnesium: 2.1 mg/dL (ref 1.7–2.4)

## 2022-06-01 MED ORDER — POTASSIUM CHLORIDE CRYS ER 20 MEQ PO TBCR
40.0000 meq | EXTENDED_RELEASE_TABLET | Freq: Once | ORAL | Status: AC
  Administered 2022-06-01: 40 meq via ORAL
  Filled 2022-06-01: qty 2

## 2022-06-01 MED ORDER — ONDANSETRON HCL 4 MG/2ML IJ SOLN
4.0000 mg | Freq: Once | INTRAMUSCULAR | Status: AC
  Administered 2022-06-01: 4 mg via INTRAVENOUS
  Filled 2022-06-01: qty 2

## 2022-06-01 MED ORDER — ENOXAPARIN SODIUM 40 MG/0.4ML IJ SOSY
40.0000 mg | PREFILLED_SYRINGE | INTRAMUSCULAR | Status: DC
  Administered 2022-06-01: 40 mg via SUBCUTANEOUS
  Filled 2022-06-01: qty 0.4

## 2022-06-01 MED ORDER — SODIUM CHLORIDE 0.9% FLUSH
3.0000 mL | Freq: Two times a day (BID) | INTRAVENOUS | Status: DC
  Administered 2022-06-01 – 2022-06-02 (×2): 3 mL via INTRAVENOUS

## 2022-06-01 MED ORDER — SODIUM CHLORIDE 0.9 % IV SOLN
2.0000 g | INTRAVENOUS | Status: DC
  Administered 2022-06-01 – 2022-06-02 (×2): 2 g via INTRAVENOUS
  Filled 2022-06-01 (×2): qty 20

## 2022-06-01 MED ORDER — POTASSIUM CHLORIDE IN NACL 20-0.9 MEQ/L-% IV SOLN
INTRAVENOUS | Status: AC
  Filled 2022-06-01: qty 1000

## 2022-06-01 MED ORDER — PANTOPRAZOLE SODIUM 40 MG IV SOLR
40.0000 mg | Freq: Once | INTRAVENOUS | Status: AC
  Administered 2022-06-01: 40 mg via INTRAVENOUS
  Filled 2022-06-01: qty 10

## 2022-06-01 MED ORDER — ACETAMINOPHEN 325 MG PO TABS: 650.0000 mg | ORAL_TABLET | Freq: Four times a day (QID) | ORAL | Status: DC | PRN

## 2022-06-01 MED ORDER — OXYCODONE HCL 5 MG PO TABS: 2.5000 mg | ORAL_TABLET | ORAL | Status: DC | PRN

## 2022-06-01 MED ORDER — ASPIRIN 325 MG PO TABS
325.0000 mg | ORAL_TABLET | Freq: Every day | ORAL | Status: DC
  Administered 2022-06-01 – 2022-06-02 (×2): 325 mg via ORAL
  Filled 2022-06-01 (×2): qty 1

## 2022-06-01 MED ORDER — SODIUM CHLORIDE (PF) 0.9 % IJ SOLN
INTRAMUSCULAR | Status: AC
  Filled 2022-06-01: qty 50

## 2022-06-01 MED ORDER — PERFLUTREN LIPID MICROSPHERE
1.0000 mL | INTRAVENOUS | Status: AC | PRN
  Administered 2022-06-01: 2 mL via INTRAVENOUS

## 2022-06-01 MED ORDER — METRONIDAZOLE 500 MG/100ML IV SOLN
500.0000 mg | Freq: Two times a day (BID) | INTRAVENOUS | Status: DC
  Administered 2022-06-01 – 2022-06-02 (×3): 500 mg via INTRAVENOUS
  Filled 2022-06-01 (×3): qty 100

## 2022-06-01 MED ORDER — KETOROLAC TROMETHAMINE 15 MG/ML IJ SOLN
15.0000 mg | Freq: Four times a day (QID) | INTRAMUSCULAR | Status: AC
  Administered 2022-06-01 – 2022-06-02 (×3): 15 mg via INTRAVENOUS
  Filled 2022-06-01 (×3): qty 1

## 2022-06-01 MED ORDER — FENTANYL CITRATE PF 50 MCG/ML IJ SOSY
50.0000 ug | PREFILLED_SYRINGE | Freq: Once | INTRAMUSCULAR | Status: AC
  Administered 2022-06-01: 50 ug via INTRAVENOUS
  Filled 2022-06-01: qty 1

## 2022-06-01 MED ORDER — PROCHLORPERAZINE EDISYLATE 10 MG/2ML IJ SOLN: 5.0000 mg | INTRAMUSCULAR | Status: DC | PRN

## 2022-06-01 MED ORDER — ACETAMINOPHEN 650 MG RE SUPP: 650.0000 mg | Freq: Four times a day (QID) | RECTAL | Status: DC | PRN

## 2022-06-01 MED ORDER — IOHEXOL 300 MG/ML  SOLN
100.0000 mL | Freq: Once | INTRAMUSCULAR | Status: AC | PRN
  Administered 2022-06-01: 100 mL via INTRAVENOUS

## 2022-06-01 MED ORDER — KETOROLAC TROMETHAMINE 15 MG/ML IJ SOLN
15.0000 mg | Freq: Once | INTRAMUSCULAR | Status: AC
  Administered 2022-06-01: 15 mg via INTRAVENOUS
  Filled 2022-06-01: qty 1

## 2022-06-01 MED ORDER — ENOXAPARIN SODIUM 40 MG/0.4ML IJ SOSY: 40.0000 mg | PREFILLED_SYRINGE | INTRAMUSCULAR | Status: DC

## 2022-06-01 MED ORDER — METHIMAZOLE 5 MG PO TABS
5.0000 mg | ORAL_TABLET | Freq: Every day | ORAL | Status: DC
  Administered 2022-06-02: 5 mg via ORAL
  Filled 2022-06-01 (×2): qty 1

## 2022-06-01 MED ORDER — SODIUM CHLORIDE 0.9 % IV BOLUS
1000.0000 mL | Freq: Once | INTRAVENOUS | Status: AC
  Administered 2022-06-01: 1000 mL via INTRAVENOUS

## 2022-06-01 NOTE — Consult Note (Signed)
Cardiology Consultation   Patient ID: LINDER PRAJAPATI MRN: [id number]; DOB: Dec 31, 1950  Admit date: 06/01/2022 Date of Consult: 06/01/2022  PCP:  Merryl Hacker No   [facility] Providers Cardiologist:  New to [facility]     Patient Profile:   Linda Goodwin is a 72 y.o. female with a hx of Graves' disease on methimazole, hypertension on HCTZ and history of GERD who is being seen 06/01/2022 for the evaluation of syncope at the request of Dr. Olevia Bowens.  History of Present Illness:   Ms. Linda Goodwin is a pleasant and healthy 72 year old female with a past medical history of Graves' disease on methimazole, hypertension on HCTZ and history of GERD.  She has no prior cardiac history.  Father passed away with congestive heart failure, mother had a history of CAD s/p stent however passed away after a major fall.  She does not drink, smoke or do any illicit drug.  She has been able to walk 1 mile several days a week without any issue.  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  Last Friday, she was tested positive with COVID from a home test kit.  A prescription for Paxlovid was sent to her pharmacy however she never picked it up.  She says the only symptom she had was sore throat and headache, she did not have any respiratory issue therefore did not plan to take antiviral medication.  She has been hydrating herself.  Her sore throat has largely resolved.  She ate pizza last night and went to sleep.  Around 11:30 PM last night, she woke up with abdominal cramping and went to the bathroom and had diarrhea.  She went to bed and later woke up again to go to the bathroom due to recurrent diarrhea.  While sitting on the toilet, she had sudden onset of dizziness, flushing sensation and sweating.  She thinks she may have attempted to get up before passing out.  She does not remember the fall.  She woke up with her face smashed against the door.  She was subsequently taken to [facility] ED for further evaluation.   Blood pressure on arrival was 131/69, heart rate 87, respiratory rate 19.  CTA of the head and neck showed facial fracture but no neck fracture.  CT of abdomen showed colitis.  Serial troponin 142-->202.  CK total 51.  Potassium 3.3, white blood cell count 14.9, hemoglobin 15.4.  EKG showed sinus rhythm with minimal diffuse ST depression.  Chest x-ray showed no acute issue.  Cardiology service consulted for syncope and elevated troponin.   Past Medical History:  Diagnosis Date   GERD (gastroesophageal reflux disease) 02/19/2017   Graves disease 02/19/2017   On meds On Methimazole   Hypertension    Seasonal allergic rhinitis 03/13/2021   Thyroid disease     Past Surgical History:  Procedure Laterality Date   ABDOMINAL HYSTERECTOMY       Home Medications:  Prior to Admission medications   Medication Sig Start Date End Date Taking? Authorizing Provider  nitrofurantoin, macrocrystal-monohydrate, (MACROBID) 100 MG capsule Take 100 mg by mouth every 12 (twelve) hours. 05/22/22  Yes [provider]  PAXLOVID, 300/100, 20 x 150 MG & 10 x '100MG'$  TBPK Take 3 tablets by mouth 2 (two) times daily. 05/25/22  Yes [provider]  rosuvastatin (CRESTOR) 5 MG tablet Take 5 mg by mouth 3 (three) times a week. 03/22/22  Yes [provider]  ciprofloxacin (CIPRO) 500 MG tablet Take 500  mg by mouth 2 (two) times daily.    [provider]  hydrochlorothiazide (HYDRODIURIL) 25 MG tablet Take 25 mg by mouth daily.    [provider]  methimazole (TAPAZOLE) 10 MG tablet Take 10 mg by mouth daily.    [provider]    Inpatient Medications: Scheduled Meds:  aspirin  325 mg Oral Daily   enoxaparin (LOVENOX) injection  40 mg Subcutaneous Q24H   sodium chloride (PF)       sodium chloride flush  3 mL Intravenous Q12H   Continuous Infusions:  0.9 % NaCl with KCl 20 mEq / L     cefTRIAXone (ROCEPHIN)  IV 2 g (06/01/22 0949)   metronidazole     PRN  Meds: acetaminophen **OR** acetaminophen, prochlorperazine, sodium chloride (PF)  Allergies:   No Known Allergies  Social History:   Social History   Socioeconomic History   Marital status: Married    Spouse name: Not on file   Number of children: Not on file   Years of education: Not on file   Highest education level: Not on file  Occupational History   Not on file  Tobacco Use   Smoking status: Never   Smokeless tobacco: Never  Vaping Use   Vaping Use: Not on file  Substance and Sexual Activity   Alcohol use: Not Currently   Drug use: Never   Sexual activity: Not Currently  Other Topics Concern   Not on file  Social History Narrative   Not on file   Social Determinants of Health   Financial Resource Strain: Not on file  Food Insecurity: Not on file  Transportation Needs: Not on file  Physical Activity: Not on file  Stress: Not on file  Social Connections: Not on file  Intimate Partner Violence: Not on file    Family History:   Family History  Problem Relation Age of Onset   Hypertension Mother    CAD Mother    Heart failure Father      ROS:  Please see the history of present illness.   All other ROS reviewed and negative.     Physical Exam/Data:   Vitals:   06/01/22 0600 06/01/22 0652 06/01/22 0700 06/01/22 0800  BP: (!) 114/95  (!) 108/57 (!) 110/53  Pulse: 94  84 83  Resp: '20  13 14  '$ Temp:  98.7 F (37.1 C)    TempSrc:  Oral    SpO2: 100%  97% 97%  Weight:      Height:        Intake/Output Summary (Last 24 hours) at 06/01/2022 1018 Last data filed at 06/01/2022 0617 Gross per 24 hour  Intake 1000 ml  Output --  Net 1000 ml      06/01/2022    2:32 AM  Last 3 Weights  Weight (lbs) 140 lb  Weight (kg) 63.504 kg     Body mass index is 25.61 kg/m.  General:  Well nourished, well developed, in no acute distress HEENT: normal Neck: no JVD Vascular: No carotid bruits; Distal pulses 2+ bilaterally Cardiac:  normal S1, S2; RRR; no murmur   Lungs:  clear to auscultation bilaterally, no wheezing, rhonchi or rales  Abd: soft, nontender, no hepatomegaly  Ext: no edema Musculoskeletal:  No deformities, BUE and BLE strength normal and equal Skin: warm and dry  Neuro:  CNs 2-12 intact, no focal abnormalities noted Psych:  Normal affect   EKG:  The EKG was personally reviewed and demonstrates: Normal sinus  rhythm with diffuse ST depression Telemetry:  Telemetry was personally reviewed and demonstrates: Normal sinus rhythm, no significant ventricular ectopy, no significant tachycardia or bradycardia or pauses.  Relevant CV Studies:  Pending echocardiogram  Laboratory Data:  High Sensitivity Troponin:   Recent Labs  Lab 06/01/22 0308 06/01/22 0559  TROPONINIHS 142* 202*     Chemistry Recent Labs  Lab 06/01/22 0308  NA 136  K 3.3*  CL 97*  CO2 27  GLUCOSE 130*  BUN 15  CREATININE 0.75  CALCIUM 9.9  GFRNONAA >60  ANIONGAP 12    Recent Labs  Lab 06/01/22 0308  PROT 7.3  ALBUMIN 4.4  AST 34  ALT 23  ALKPHOS 72  BILITOT 0.6   Lipids No results for input(s): "CHOL", "TRIG", "HDL", "LABVLDL", "LDLCALC", "CHOLHDL" in the last 168 hours.  Hematology Recent Labs  Lab 06/01/22 0308  WBC 14.9*  RBC 4.88  HGB 15.4*  HCT 45.3  MCV 92.8  MCH 31.6  MCHC 34.0  RDW 11.4*  PLT 278   Thyroid No results for input(s): "TSH", "FREET4" in the last 168 hours.  BNPNo results for input(s): "BNP", "PROBNP" in the last 168 hours.  DDimer No results for input(s): "DDIMER" in the last 168 hours.   Radiology/Studies:  CT ABDOMEN PELVIS W CONTRAST  Result Date: 06/01/2022 CLINICAL DATA:  72 year old female with recent COVID-19. Diarrhea. Syncope, fall from toilet. Left eye injury. EXAM: CT ABDOMEN AND PELVIS WITH CONTRAST TECHNIQUE: Multidetector CT imaging of the abdomen and pelvis was performed using the standard protocol following bolus administration of intravenous contrast. RADIATION DOSE REDUCTION: This exam was  performed according to the departmental dose-optimization program which includes automated exposure control, adjustment of the mA and/or kV according to patient size and/or use of iterative reconstruction technique. CONTRAST:  141m OMNIPAQUE IOHEXOL 300 MG/ML  SOLN COMPARISON:  None Available. FINDINGS: Lower chest: Lung bases appear clear aside from minimal atelectasis. No cardiomegaly, pericardial effusion, pleural effusion. Hepatobiliary: Negative liver. Partially contracted gallbladder remains within normal limits. No bile duct enlargement. Pancreas: Negative. Spleen: Negative. Adrenals/Urinary Tract: Normal adrenal glands. Symmetric renal enhancement and contrast excretion. No nephrolithiasis, hydronephrosis or renal inflammation identified. Decompressed ureters. Occasional pelvic phleboliths. Unremarkable bladder. Stomach/Bowel: Decompressed and mildly indistinct appearance of the large bowel throughout the abdomen and pelvis to the rectum. Superimposed sigmoid diverticulosis. Appendix remains normal (coronal image 39). Terminal ileum is decompressed. No dilated or convincing inflamed small bowel. Stomach is decompressed. Possible small gastric hiatal hernia. No free air or free fluid. Vascular/Lymphatic: Mild Calcified aortic atherosclerosis. Major arterial structures appear patent and otherwise normal in the abdomen and pelvis. Portal venous system is patent. No lymphadenopathy identified. Reproductive: Surgically absent uterus. Diminutive and normal ovaries. Other: No pelvic free fluid. Musculoskeletal: Osteopenia. No acute osseous abnormality identified. IMPRESSION: 1. Decompressed and indistinct appearance of the large bowel compatible with a a mild diffuse Colitis. Appendix remains normal. 2. No other acute or inflammatory process identified in the abdomen or pelvis. 3.  Aortic Atherosclerosis (ICD10-I70.0). Electronically Signed   By: HGenevie AnnM.D.   On: 06/01/2022 06:07   CT Maxillofacial Wo  Contrast  Result Date: 06/01/2022 CLINICAL DATA:  73year old female with recent COVID-19. Diarrhea. Syncope, fall from toilet. Left eye injury. EXAM: CT MAXILLOFACIAL WITHOUT CONTRAST TECHNIQUE: Multidetector CT imaging of the maxillofacial structures was performed. Multiplanar CT image reconstructions were also generated. RADIATION DOSE REDUCTION: This exam was performed according to the departmental dose-optimization program which includes automated exposure control, adjustment of the mA  and/or kV according to patient size and/or use of iterative reconstruction technique. COMPARISON:  CT head and cervical spine today. FINDINGS: Osseous: Mandible intact and normally located. Right TMJ degeneration. Right side zygoma and maxilla intact. No convincing nasal bone fracture. Bilateral pterygoid bones intact. Highly comminuted fractures of the left anterior and posterior maxillary walls, with involvement of the left-side go Matic 0 maxillary complex and mildly displaced left side Matic arch fracture. Left nasal process of the maxilla remains intact. Orbits: Comminuted left orbital floor fracture with only trace herniated intraorbital fat. Comminuted minimally displaced left orbit lateral wall fracture. Left orbit roof and lamina papyracea remain intact. Small volume posttraumatic gas within the left orbit, inferior along the inferior rectus muscle. No measurable intraorbital hematoma. Left globe appears intact. Contralateral right orbit walls and soft tissues appear intact. Sinuses: Hemorrhage and herniated fat within the left maxillary sinus (mostly fat is herniated from the retro maxillary space on that side. Comparatively mild opacification of the left frontal sinus and ethmoids. Bilateral sphenoid sinuses are clear. Mild paranasal sinus opacification elsewhere. Tympanic cavities and mastoids remain clear. Soft tissues: Posttraumatic gas in the left premalar space, left masticator space. Hematoma/contusion  overlying the lateral left mandible. Bulky postinflammatory dystrophic calcifications of the palatine tonsils. Otherwise negative visible noncontrast deep soft tissue spaces of the face. Limited intracranial: Reported separately. IMPRESSION: 1. Highly comminuted Left Tripod Fracture. Minimally herniated intraorbital fat. Small volume posttraumatic gas within the orbit. No intraorbital hematoma. Hemorrhage and retro maxillary fat within the left maxillary sinus. 2. Posttraumatic soft tissue gas and contusion of the superficial left face, and in the left masticator space. Electronically Signed   By: Genevie Ann M.D.   On: 06/01/2022 05:59   CT Cervical Spine Wo Contrast  Result Date: 06/01/2022 CLINICAL DATA:  72 year old female with recent COVID-19. Diarrhea. Syncope, fall from toilet. Left eye injury. EXAM: CT CERVICAL SPINE WITHOUT CONTRAST TECHNIQUE: Multidetector CT imaging of the cervical spine was performed without intravenous contrast. Multiplanar CT image reconstructions were also generated. RADIATION DOSE REDUCTION: This exam was performed according to the departmental dose-optimization program which includes automated exposure control, adjustment of the mA and/or kV according to patient size and/or use of iterative reconstruction technique. COMPARISON:  CT head and face today reported separately. FINDINGS: Alignment: Straightening of cervical lordosis. Cervicothoracic junction alignment is within normal limits. Bilateral posterior element alignment is within normal limits. Skull base and vertebrae: Visualized skull base is intact. No atlanto-occipital dissociation. C1 and C2 appear intact and aligned. No acute osseous abnormality identified in the cervical spine. Soft tissues and spinal canal: No prevertebral fluid or swelling. No visible canal hematoma. Negative noncontrast visible neck soft tissues. Disc levels:  Mild for age cervical spine degeneration. Upper chest: Mild to moderate apical lung scarring.  Visible upper thoracic levels appear intact. IMPRESSION: 1. No acute traumatic injury identified in the cervical spine. 2. Mild for age cervical spine degeneration. Electronically Signed   By: Genevie Ann M.D.   On: 06/01/2022 05:54   CT Head Wo Contrast  Result Date: 06/01/2022 CLINICAL DATA:  72 year old female with recent COVID-19. Diarrhea. Syncope, fall from toilet. Left eye injury. EXAM: CT HEAD WITHOUT CONTRAST TECHNIQUE: Contiguous axial images were obtained from the base of the skull through the vertex without intravenous contrast. RADIATION DOSE REDUCTION: This exam was performed according to the departmental dose-optimization program which includes automated exposure control, adjustment of the mA and/or kV according to patient size and/or use of iterative reconstruction technique. COMPARISON:  Face CT reported separately. FINDINGS: Brain: Cerebral volume is within normal limits for age. No midline shift, ventriculomegaly, mass effect, evidence of mass lesion, intracranial hemorrhage or evidence of cortically based acute infarction. Gray-white matter differentiation is within normal limits throughout the brain. Vascular: Mild Calcified atherosclerosis at the skull base. No suspicious intracranial vascular hyperdensity. Skull: Comminuted left orbit and left maxillary fractures. See face CT details. Left frontal bone and calvarium appear to remain intact. Sinuses/Orbits: Hemorrhage in the left maxillary sinus associated with left facial fractures. Tympanic cavities and mastoids are clear. Other: Left orbital fracture and soft tissue injury detailed on face CT separately. Superior scalp soft tissues remain normal. IMPRESSION: 1. Comminuted left orbit and face, probable tripod fracture, see Face CT reported separately. 2. Normal for age non contrast CT appearance of the brain. Electronically Signed   By: Genevie Ann M.D.   On: 06/01/2022 05:52   DG Chest Portable 1 View  Result Date: 06/01/2022 CLINICAL DATA:   Screening.  Syncope EXAM: PORTABLE CHEST 1 VIEW COMPARISON:  None Available. FINDINGS: The heart size and mediastinal contours are within normal limits. Both lungs are clear. The visualized skeletal structures are unremarkable. IMPRESSION: No active disease. Electronically Signed   By: Placido Sou M.D.   On: 06/01/2022 03:53     Assessment and Plan:   Syncope: Accompanied by flushing sensation and diaphoresis prior to syncope.  Occurred while patient was having diarrhea on the toilet.  CT scan showed diffuse colitis.  Patient has been hydrating herself after tested positive for COVID last Friday.  Symptoms sounds more consistent with vasovagal syncope, pending echocardiogram.  Telemetry has not shown any significant arrhythmia.  QTc is not significantly prolonged.  Will defer to MD to consider whether to order outpatient heart monitor at the time of discharge.  Elevated troponin: Likely demand ischemia, EKG shows sinus rhythm with diffuse minimal ST depression.  Obtain echocardiogram to check for EF and wall motion.  Denies any recent exertional symptoms, great functional ability and was able to walk 1 mile several days a week.  Suspicion for ACS fairly low.  Hypokalemia: K 3.3 on arrival, on hydrochlorothiazide at home and also had diarrhea last night.  More likely to be related to diarrhea.  Repleted with a single dose of 40 mEq this morning.  Diarrhea: CT of abdomen showed colitis  COVID-19: She had a positive home test last Friday.  No respiratory symptoms, described her COVID symptom as headache and a sore throat.  Facial fracture: As result of syncope and fall with her face smashed against the door.  She had comminuted left orbital, face and a probable tripod fracture seen on CT scan.   Risk Assessment/Risk Scores:                For questions or updates, please contact [facility] Please consult [email or web address] for contact info under    Hilbert Corrigan, [place]   06/01/2022 10:18 AM

## 2022-06-01 NOTE — Telephone Encounter (Signed)
Spoke with daughter, and she stated they are still in the ER and they will call once they have been discharged to set up an appt.

## 2022-06-01 NOTE — ED Triage Notes (Signed)
Pt bib [facility] from home s/p syncopal episode. Pt dx w/ Covid 05/25/22 has been dealing w/ sx. Pt was experiencing diarrhea today at approximately 2300 pt was sitting on commode during an episode of diarrhea and states she woke up on the floor. Pt was on floor approximately 30 minutes. Pt does have bruising and swelling to left eye. No anticoagulants.

## 2022-06-01 NOTE — ED Provider Notes (Signed)
[facility] DEPT Provider Note  CSN: [id number] Arrival date & time: 06/01/22 0215  Chief Complaint(s) Loss of Consciousness  HPI Linda Goodwin is a 72 y.o. female with past medical history as below, significant for HTN, thyroid disease who presents to the ED with complaint of diarrhea, LOC, facial injury. Pt reports she had [employer] for dinner, she woke up around midnight or so with stomach cramping, had to go to restroom, had multiple episodes of diarrhea, no melena/no brbpr. She sat on the toilet for an extended period of time with the diarrhea/abd cramping. She stood up from the toilet and felt light headed, she fell to the ground and woke up around 30 minutes later per the pt. She believes she had LOC prior to hitting the ground. She was able to get up from the floor after the fall. No thinners. Reports headache/facial pain, abd cramping but o/w feels in her normal state of health. No further diarrhea since initial episodes, nausea w/o emesis was reported. No cp or dib. Has some tingling to her face left cheek where she hit the ground but o/w no sensation changes.    Past Medical History Past Medical History:  Diagnosis Date   Hypertension    Thyroid disease    There are no problems to display for this patient.  Home Medication(s) Prior to Admission medications   Not on File                                                                                                                                    Past Surgical History Past Surgical History:  Procedure Laterality Date   ABDOMINAL HYSTERECTOMY     Family History No family history on file.  Social History Social History   Tobacco Use   Smoking status: Never   Smokeless tobacco: Never  Substance Use Topics   Alcohol use: Not Currently   Drug use: Not Currently   Allergies Patient has no known allergies.  Review of Systems Review of Systems  Constitutional:  Negative for chills and  fever.  HENT:  Negative for facial swelling and trouble swallowing.   Eyes:  Negative for photophobia and visual disturbance.  Respiratory:  Negative for cough and shortness of breath.   Cardiovascular:  Negative for chest pain and palpitations.  Gastrointestinal:  Positive for abdominal pain and diarrhea. Negative for vomiting.  Endocrine: Negative for polydipsia and polyuria.  Genitourinary:  Negative for difficulty urinating and hematuria.  Musculoskeletal:  Negative for gait problem and joint swelling.  Skin:  Negative for pallor and rash.  Neurological:  Positive for syncope. Negative for headaches.  Psychiatric/Behavioral:  Negative for agitation and confusion.     Physical Exam Vital Signs  I have reviewed the triage vital signs BP (!) 108/57   Pulse 84   Temp 98.7 F (37.1 C) (Oral)   Resp 13   Ht '5\' 2"'$  (1.575  m)   Wt 63.5 kg   SpO2 97%   BMI 25.61 kg/m  Physical Exam Vitals and nursing note reviewed.  Constitutional:      General: She is not in acute distress.    Appearance: Normal appearance.  HENT:     Head: Normocephalic. Left periorbital erythema present.     Jaw: There is normal jaw occlusion. No malocclusion.      Right Ear: External ear normal.     Left Ear: External ear normal.     Nose: Nose normal.     Mouth/Throat:     Mouth: Mucous membranes are moist.  Eyes:     General: Vision grossly intact. Gaze aligned appropriately. No scleral icterus.       Right eye: No discharge.        Left eye: No discharge.     Extraocular Movements: Extraocular movements intact.     Conjunctiva/sclera:     Left eye: Hemorrhage present.     Pupils: Pupils are equal, round, and reactive to light.     Comments: EOMI  Cardiovascular:     Rate and Rhythm: Normal rate and regular rhythm.     Pulses: Normal pulses.     Heart sounds: Normal heart sounds.  Pulmonary:     Effort: Pulmonary effort is normal. No respiratory distress.     Breath sounds: Normal breath  sounds.  Abdominal:     General: Abdomen is flat.     Tenderness: There is generalized abdominal tenderness.  Musculoskeletal:        General: Normal range of motion.     Cervical back: Normal range of motion.     Right lower leg: No edema.     Left lower leg: No edema.  Skin:    General: Skin is warm and dry.     Capillary Refill: Capillary refill takes less than 2 seconds.  Neurological:     Mental Status: She is alert and oriented to person, place, and time.     GCS: GCS eye subscore is 4. GCS verbal subscore is 5. GCS motor subscore is 6.     Cranial Nerves: Cranial nerves 2-12 are intact.     Sensory: Sensation is intact.     Motor: Motor function is intact.     Coordination: Coordination is intact.     Comments: Gait not tested 2/2 pt safety   Psychiatric:        Mood and Affect: Mood normal.        Behavior: Behavior normal.     ED Results and Treatments Labs (all labs ordered are listed, but only abnormal results are displayed) Labs Reviewed  CBC WITH DIFFERENTIAL/PLATELET - Abnormal; Notable for the following components:      Result Value   WBC 14.9 (*)    Hemoglobin 15.4 (*)    RDW 11.4 (*)    Neutro Abs 13.3 (*)    All other components within normal limits  COMPREHENSIVE METABOLIC PANEL - Abnormal; Notable for the following components:   Potassium 3.3 (*)    Chloride 97 (*)    Glucose, Bld 130 (*)    All other components within normal limits  TROPONIN I (HIGH SENSITIVITY) - Abnormal; Notable for the following components:   Troponin I (High Sensitivity) 142 (*)    All other components within normal limits  TROPONIN I (HIGH SENSITIVITY) - Abnormal; Notable for the following components:   Troponin I (High Sensitivity) 202 (*)    All other  components within normal limits  LIPASE, BLOOD  CK  URINALYSIS, ROUTINE W REFLEX MICROSCOPIC                                                                                                                           Radiology CT ABDOMEN PELVIS W CONTRAST  Result Date: 06/01/2022 CLINICAL DATA:  72 year old female with recent COVID-19. Diarrhea. Syncope, fall from toilet. Left eye injury. EXAM: CT ABDOMEN AND PELVIS WITH CONTRAST TECHNIQUE: Multidetector CT imaging of the abdomen and pelvis was performed using the standard protocol following bolus administration of intravenous contrast. RADIATION DOSE REDUCTION: This exam was performed according to the departmental dose-optimization program which includes automated exposure control, adjustment of the mA and/or kV according to patient size and/or use of iterative reconstruction technique. CONTRAST:  145m OMNIPAQUE IOHEXOL 300 MG/ML  SOLN COMPARISON:  None Available. FINDINGS: Lower chest: Lung bases appear clear aside from minimal atelectasis. No cardiomegaly, pericardial effusion, pleural effusion. Hepatobiliary: Negative liver. Partially contracted gallbladder remains within normal limits. No bile duct enlargement. Pancreas: Negative. Spleen: Negative. Adrenals/Urinary Tract: Normal adrenal glands. Symmetric renal enhancement and contrast excretion. No nephrolithiasis, hydronephrosis or renal inflammation identified. Decompressed ureters. Occasional pelvic phleboliths. Unremarkable bladder. Stomach/Bowel: Decompressed and mildly indistinct appearance of the large bowel throughout the abdomen and pelvis to the rectum. Superimposed sigmoid diverticulosis. Appendix remains normal (coronal image 39). Terminal ileum is decompressed. No dilated or convincing inflamed small bowel. Stomach is decompressed. Possible small gastric hiatal hernia. No free air or free fluid. Vascular/Lymphatic: Mild Calcified aortic atherosclerosis. Major arterial structures appear patent and otherwise normal in the abdomen and pelvis. Portal venous system is patent. No lymphadenopathy identified. Reproductive: Surgically absent uterus. Diminutive and normal ovaries. Other: No pelvic free fluid.  Musculoskeletal: Osteopenia. No acute osseous abnormality identified. IMPRESSION: 1. Decompressed and indistinct appearance of the large bowel compatible with a a mild diffuse Colitis. Appendix remains normal. 2. No other acute or inflammatory process identified in the abdomen or pelvis. 3.  Aortic Atherosclerosis (ICD10-I70.0). Electronically Signed   By: HGenevie AnnM.D.   On: 06/01/2022 06:07   CT Maxillofacial Wo Contrast  Result Date: 06/01/2022 CLINICAL DATA:  72year old female with recent COVID-19. Diarrhea. Syncope, fall from toilet. Left eye injury. EXAM: CT MAXILLOFACIAL WITHOUT CONTRAST TECHNIQUE: Multidetector CT imaging of the maxillofacial structures was performed. Multiplanar CT image reconstructions were also generated. RADIATION DOSE REDUCTION: This exam was performed according to the departmental dose-optimization program which includes automated exposure control, adjustment of the mA and/or kV according to patient size and/or use of iterative reconstruction technique. COMPARISON:  CT head and cervical spine today. FINDINGS: Osseous: Mandible intact and normally located. Right TMJ degeneration. Right side zygoma and maxilla intact. No convincing nasal bone fracture. Bilateral pterygoid bones intact. Highly comminuted fractures of the left anterior and posterior maxillary walls, with involvement of the left-side go Matic 0 maxillary complex and mildly displaced left side Matic arch fracture. Left nasal process of the maxilla remains  intact. Orbits: Comminuted left orbital floor fracture with only trace herniated intraorbital fat. Comminuted minimally displaced left orbit lateral wall fracture. Left orbit roof and lamina papyracea remain intact. Small volume posttraumatic gas within the left orbit, inferior along the inferior rectus muscle. No measurable intraorbital hematoma. Left globe appears intact. Contralateral right orbit walls and soft tissues appear intact. Sinuses: Hemorrhage and herniated  fat within the left maxillary sinus (mostly fat is herniated from the retro maxillary space on that side. Comparatively mild opacification of the left frontal sinus and ethmoids. Bilateral sphenoid sinuses are clear. Mild paranasal sinus opacification elsewhere. Tympanic cavities and mastoids remain clear. Soft tissues: Posttraumatic gas in the left premalar space, left masticator space. Hematoma/contusion overlying the lateral left mandible. Bulky postinflammatory dystrophic calcifications of the palatine tonsils. Otherwise negative visible noncontrast deep soft tissue spaces of the face. Limited intracranial: Reported separately. IMPRESSION: 1. Highly comminuted Left Tripod Fracture. Minimally herniated intraorbital fat. Small volume posttraumatic gas within the orbit. No intraorbital hematoma. Hemorrhage and retro maxillary fat within the left maxillary sinus. 2. Posttraumatic soft tissue gas and contusion of the superficial left face, and in the left masticator space. Electronically Signed   By: Genevie Ann M.D.   On: 06/01/2022 05:59   CT Cervical Spine Wo Contrast  Result Date: 06/01/2022 CLINICAL DATA:  72 year old female with recent COVID-19. Diarrhea. Syncope, fall from toilet. Left eye injury. EXAM: CT CERVICAL SPINE WITHOUT CONTRAST TECHNIQUE: Multidetector CT imaging of the cervical spine was performed without intravenous contrast. Multiplanar CT image reconstructions were also generated. RADIATION DOSE REDUCTION: This exam was performed according to the departmental dose-optimization program which includes automated exposure control, adjustment of the mA and/or kV according to patient size and/or use of iterative reconstruction technique. COMPARISON:  CT head and face today reported separately. FINDINGS: Alignment: Straightening of cervical lordosis. Cervicothoracic junction alignment is within normal limits. Bilateral posterior element alignment is within normal limits. Skull base and vertebrae:  Visualized skull base is intact. No atlanto-occipital dissociation. C1 and C2 appear intact and aligned. No acute osseous abnormality identified in the cervical spine. Soft tissues and spinal canal: No prevertebral fluid or swelling. No visible canal hematoma. Negative noncontrast visible neck soft tissues. Disc levels:  Mild for age cervical spine degeneration. Upper chest: Mild to moderate apical lung scarring. Visible upper thoracic levels appear intact. IMPRESSION: 1. No acute traumatic injury identified in the cervical spine. 2. Mild for age cervical spine degeneration. Electronically Signed   By: Genevie Ann M.D.   On: 06/01/2022 05:54   CT Head Wo Contrast  Result Date: 06/01/2022 CLINICAL DATA:  72 year old female with recent COVID-19. Diarrhea. Syncope, fall from toilet. Left eye injury. EXAM: CT HEAD WITHOUT CONTRAST TECHNIQUE: Contiguous axial images were obtained from the base of the skull through the vertex without intravenous contrast. RADIATION DOSE REDUCTION: This exam was performed according to the departmental dose-optimization program which includes automated exposure control, adjustment of the mA and/or kV according to patient size and/or use of iterative reconstruction technique. COMPARISON:  Face CT reported separately. FINDINGS: Brain: Cerebral volume is within normal limits for age. No midline shift, ventriculomegaly, mass effect, evidence of mass lesion, intracranial hemorrhage or evidence of cortically based acute infarction. Vasilia Dise-white matter differentiation is within normal limits throughout the brain. Vascular: Mild Calcified atherosclerosis at the skull base. No suspicious intracranial vascular hyperdensity. Skull: Comminuted left orbit and left maxillary fractures. See face CT details. Left frontal bone and calvarium appear to remain intact. Sinuses/Orbits: Hemorrhage in the  left maxillary sinus associated with left facial fractures. Tympanic cavities and mastoids are clear. Other:  Left orbital fracture and soft tissue injury detailed on face CT separately. Superior scalp soft tissues remain normal. IMPRESSION: 1. Comminuted left orbit and face, probable tripod fracture, see Face CT reported separately. 2. Normal for age non contrast CT appearance of the brain. Electronically Signed   By: Genevie Ann M.D.   On: 06/01/2022 05:52   DG Chest Portable 1 View  Result Date: 06/01/2022 CLINICAL DATA:  Screening.  Syncope EXAM: PORTABLE CHEST 1 VIEW COMPARISON:  None Available. FINDINGS: The heart size and mediastinal contours are within normal limits. Both lungs are clear. The visualized skeletal structures are unremarkable. IMPRESSION: No active disease. Electronically Signed   By: Placido Sou M.D.   On: 06/01/2022 03:53    Pertinent labs & imaging results that were available during my care of the patient were reviewed by me and considered in my medical decision making (see MDM for details).  Medications Ordered in ED Medications  sodium chloride (PF) 0.9 % injection (has no administration in time range)  aspirin tablet 325 mg (has no administration in time range)  sodium chloride 0.9 % bolus 1,000 mL (0 mLs Intravenous Stopped 06/01/22 0617)  iohexol (OMNIPAQUE) 300 MG/ML solution 100 mL (100 mLs Intravenous Contrast Given 06/01/22 0521)  fentaNYL (SUBLIMAZE) injection 50 mcg (50 mcg Intravenous Given 06/01/22 0621)  ondansetron (ZOFRAN) injection 4 mg (4 mg Intravenous Given 06/01/22 0621)                                                                                                                                     Procedures .Critical Care  Performed by: Jeanell Sparrow, DO Authorized by: Jeanell Sparrow, DO   Critical care provider statement:    Critical care time (minutes):  30   Critical care time was exclusive of:  Separately billable procedures and treating other patients   Critical care was necessary to treat or prevent imminent or life-threatening deterioration of  the following conditions:  Trauma and cardiac failure   Critical care was time spent personally by me on the following activities:  Development of treatment plan with patient or surrogate, discussions with consultants, evaluation of patient's response to treatment, examination of patient, ordering and review of laboratory studies, ordering and review of radiographic studies, ordering and performing treatments and interventions, pulse oximetry, re-evaluation of patient's condition, review of old charts and obtaining history from patient or surrogate   (including critical care time)  Medical Decision Making / ED Course   MDM:  Linda Goodwin is a 72 y.o. female with past medical history as below, significant for HTN, thyroid disease who presents to the ED with complaint of diarrhea, LOC, facial injury.. The complaint involves an extensive differential diagnosis and also carries with it a high risk of complications and morbidity.  Serious etiology was considered. Ddx  includes but is not limited to: Differential diagnosis includes but is not exclusive to ectopic pregnancy, ovarian cyst, ovarian torsion, acute appendicitis, urinary tract infection, endometriosis, bowel obstruction, hernia, colitis, renal colic, gastroenteritis, volvulus, cardiac syncope, vasovagal, orthostatic, etc  On initial assessment the patient is: resting comfortably, HDS, pain to face/headache. Cramping to abdomen. Soft/not peritoneal abdomen.  Vital signs and nursing notes were reviewed    Pt with syncope after bowel movement; facial injury, syncopal episode. CPK is wnl. WBC elev, favor 2/2 trauma. Trop is elevated but EKG is stable, no stemi. No chest pain or dib. She has extensive facial bone fractures. Colitis on CT. Recommend admission given elevated troponin and facial fractures. Signed out to incoming EDP pending ENT consult and deltra trop, eventual admission. She is HDS. Unclear source of her syncope, possibly cardiac source  given elevated troponin. Cr is WNL/BUN wnl. Give Ivf in setting of recent diarrhea a/w her syncope today.    Additional history obtained: -Additional history obtained from na -External records from outside source obtained and reviewed including: Chart review including previous notes, labs, imaging, consultation notes including [facility] records, pdmp   Lab Tests: -I ordered, reviewed, and interpreted labs.   The pertinent results include:   Labs Reviewed  CBC WITH DIFFERENTIAL/PLATELET - Abnormal; Notable for the following components:      Result Value   WBC 14.9 (*)    Hemoglobin 15.4 (*)    RDW 11.4 (*)    Neutro Abs 13.3 (*)    All other components within normal limits  COMPREHENSIVE METABOLIC PANEL - Abnormal; Notable for the following components:   Potassium 3.3 (*)    Chloride 97 (*)    Glucose, Bld 130 (*)    All other components within normal limits  TROPONIN I (HIGH SENSITIVITY) - Abnormal; Notable for the following components:   Troponin I (High Sensitivity) 142 (*)    All other components within normal limits  TROPONIN I (HIGH SENSITIVITY) - Abnormal; Notable for the following components:   Troponin I (High Sensitivity) 202 (*)    All other components within normal limits  LIPASE, BLOOD  CK  URINALYSIS, ROUTINE W REFLEX MICROSCOPIC    Notable for trop elev, leukocytosis likely 2/2 trauma   EKG   EKG Interpretation  Date/Time:  Friday June 01 2022 05:03:21 EST Ventricular Rate:  94 PR Interval:  218 QRS Duration: 98 QT Interval:  369 QTC Calculation: 462 R Axis:   24 Text Interpretation: Sinus rhythm Prolonged PR interval Minimal ST depression, anterolateral leads grossly  unchanged from tracing same day Confirmed by Wynona Dove ([id number]) on 06/01/2022 5:57:57 AM         Imaging Studies ordered: I ordered imaging studies including CT head/face/cspine/ctap I independently visualized the following imaging with scope of interpretation limited to determining  acute life threatening conditions related to emergency care: zygomatic tripod fracture, colotis I independently visualized and interpreted imaging. I agree with the radiologist interpretation   Medicines ordered and prescription drug management: Meds ordered this encounter  Medications   sodium chloride 0.9 % bolus 1,000 mL   sodium chloride (PF) 0.9 % injection    Angelica Chessman M: cabinet override   iohexol (OMNIPAQUE) 300 MG/ML solution 100 mL   fentaNYL (SUBLIMAZE) injection 50 mcg   ondansetron (ZOFRAN) injection 4 mg   aspirin tablet 325 mg    -I have reviewed the patients home medicines and have made adjustments as needed   Consultations Obtained: I requested consultation with the na,  and discussed lab and imaging findings as well as pertinent plan - they recommend: na   Cardiac Monitoring: The patient was maintained on a cardiac monitor.  I personally viewed and interpreted the cardiac monitored which showed an underlying rhythm of: NSR  Social Determinants of Health:  Diagnosis or treatment significantly limited by social determinants of health: lives alone   Reevaluation: After the interventions noted above, I reevaluated the patient and found that they have improved  Co morbidities that complicate the patient evaluation  Past Medical History:  Diagnosis Date   Hypertension    Thyroid disease       Dispostion: Disposition decision including need for hospitalization was considered, and patient dispo pending at shift change     Final Clinical Impression(s) / ED Diagnoses Final diagnoses:  Syncope and collapse  NSTEMI (non-ST elevated myocardial infarction) ([place])  Closed fracture of left zygomatic arch, initial encounter ([place])     This chart was dictated using voice recognition software.  Despite best efforts to proofread,  errors can occur which can change the documentation meaning.    Jeanell Sparrow, DO 06/01/22 [phone]

## 2022-06-01 NOTE — ED Provider Notes (Signed)
  Physical Exam  BP (!) 108/57   Pulse 84   Temp 98.7 F (37.1 C) (Oral)   Resp 13   Ht '5\' 2"'$  (1.575 m)   Wt 63.5 kg   SpO2 97%   BMI 25.61 kg/m   Physical Exam Vitals and nursing note reviewed.  Constitutional:      General: She is not in acute distress.    Appearance: She is well-developed.  HENT:     Head: Normocephalic.     Comments: Left orbital bruising with a conjunctival hematoma, no entrapment Eyes:     Conjunctiva/sclera: Conjunctivae normal.  Cardiovascular:     Rate and Rhythm: Normal rate and regular rhythm.     Heart sounds: No murmur heard. Pulmonary:     Effort: Pulmonary effort is normal. No respiratory distress.     Breath sounds: Normal breath sounds.  Abdominal:     Palpations: Abdomen is soft.     Tenderness: There is no abdominal tenderness.  Musculoskeletal:        General: No swelling.     Cervical back: Neck supple.  Skin:    General: Skin is warm and dry.     Capillary Refill: Capillary refill takes less than 2 seconds.  Neurological:     Mental Status: She is alert.  Psychiatric:        Mood and Affect: Mood normal.     Procedures  .Critical Care  Performed by: Teressa Lower, MD Authorized by: Teressa Lower, MD   Critical care provider statement:    Critical care time (minutes):  30   Critical care was necessary to treat or prevent imminent or life-threatening deterioration of the following conditions:  Cardiac failure   Critical care was time spent personally by me on the following activities:  Development of treatment plan with patient or surrogate, discussions with consultants, evaluation of patient's response to treatment, examination of patient, ordering and review of laboratory studies, ordering and review of radiographic studies, ordering and performing treatments and interventions, pulse oximetry, re-evaluation of patient's condition and review of old charts   ED Course / MDM    Medical Decision Making Amount and/or  Complexity of Data Reviewed Labs: ordered. Radiology: ordered.  Risk OTC drugs. Prescription drug management. Decision regarding hospitalization.   Received an handoff.  Syncopal event after bowel movement with tripod fracture of the face and elevated troponin.  Pending delta troponin at time of signout and ENT consult for the face.  I did speak with the ENT on-call for trauma Dr. Merri Ray who is recommending elevation of the head of the bed, sinus precautions and outpatient follow-up next week in clinic but no antibiotics needed at this time and no acute indication for surgery.  Delta troponin is uptrending and aspirin given.  I spoke with Dr. Johnsie Cancel of cardiology who states that Dr. Harl Bowie is rounding at [facility] and will assist the inpatient team.  Patient then was admitted for high risk syncope in the setting of uptrending troponin.       Teressa Lower, MD 06/01/22 [phone]

## 2022-06-01 NOTE — H&P (Addendum)
History and Physical    Patient: Linda Goodwin AOZ:[id number] DOB: [birth year] DOA: 06/01/2022 DOS: the patient was seen and examined on 06/01/2022 PCP: Pcp, No  Patient coming from: Home  Chief Complaint:  Chief Complaint  Patient presents with   Loss of Consciousness   HPI: Linda Goodwin is a 72 y.o. female with medical history significant of GERD, Graves' disease, hypertension, seasonal allergic rhinitis, herpes zoster, grade 1 VIN who is coming to the emergency department after having a syncopal episode while she was in the bathroom.  The patient stated that last night she ate pizza for dinner and went to sleep later with no symptoms.  She woke up around midnight with crampy abdominal pain, had multiple episodes of diarrhea, then she was trying to get up from the toilet, she passed out and fell on the left side of her face sustaining a contusion of the left cheek and eye.  She thinks she woke up about 30 minutes later.  She denied any prodromal symptoms. No chest pain, palpitations, diaphoresis, PND, orthopnea or pitting edema of the lower extremities. She denied fever, chills, rhinorrhea, sore throat, wheezing or hemoptysis.   No abdominal pain, nausea, emesis, diarrhea, constipation, melena or hematochezia.  No flank pain, dysuria, frequency or hematuria.  No polyuria, polydipsia, polyphagia or blurred vision.   ED course: Initial vital signs were temperature 97.4 F, pulse 87, respiration 19, BP 131/69 mmHg O2 sat 100% on room air.  The patient received fentanyl 50 mcg IVP, ondansetron 4 mg IVP and 1000 mL of normal saline bolus.  Lab work: CBC showed a white count of 14.9, hemoglobin 15.4 g/dL platelets 278.  Lipase was 142 then 202 ng/L.  Total CK was 51 and lipase 42 units/L.  CMP showed a potassium of 3.3 and chloride 97 mmol/L.  Glucose 130 mg/dL.  The rest of the CMP measurements were normal.  Imaging: Portable 1 view chest radiograph with no active disease.  CT head without contrast showed  a comminuted left orbit and face, probable tripod fracture.  CT maxillofacial with fracture as above open it with minimally herniated intraorbital fat.  There are small body and posttraumatic gas within the orbit.  No intra orbital hematoma.  Hemorrhagic and retromaxillary fat within the left maxillary sinus.  Posttraumatic soft tissue gas and contusion of the superficial left face and in the left masticator space.  CT cervical spine with no acute traumatic injury identified in the cervical spine.  Mild for age cervical spine degeneration.  CT abdomen/pelvis with contrast showing a decompressing in the Sting appearance of the large bowel compatible with a mild diffuse colitis.  No other acute or inflammatory process identified.  Minimal atelectasis of both bases.  There is sigmoid diverticulosis.  Occasional pelvic phleboliths.  Aortic atherosclerosis.   Review of Systems: As mentioned in the history of present illness. All other systems reviewed and are negative.  Past Medical History:  Diagnosis Date   GERD (gastroesophageal reflux disease) 02/19/2017   Graves disease 02/19/2017   On meds On Methimazole   Hypertension    Seasonal allergic rhinitis 03/13/2021   Thyroid disease    Past Surgical History:  Procedure Laterality Date   ABDOMINAL HYSTERECTOMY     Social History:  reports that she has never smoked. She has never used smokeless tobacco. She reports that she does not currently use alcohol. She reports that she does not currently use drugs.  No Known Allergies  History reviewed. No pertinent family history.  Prior to Admission medications   Medication Sig Start Date End Date Taking? Authorizing Provider  nitrofurantoin, macrocrystal-monohydrate, (MACROBID) 100 MG capsule Take 100 mg by mouth every 12 (twelve) hours. 05/22/22  Yes [provider]  PAXLOVID, 300/100, 20 x 150 MG & 10 x '100MG'$  TBPK Take 3 tablets by mouth 2 (two) times daily. 05/25/22  Yes [provider]   rosuvastatin (CRESTOR) 5 MG tablet Take 5 mg by mouth 3 (three) times a week. 03/22/22  Yes [provider]  ciprofloxacin (CIPRO) 500 MG tablet Take 500 mg by mouth 2 (two) times daily.    [provider]  hydrochlorothiazide (HYDRODIURIL) 25 MG tablet Take 25 mg by mouth daily.    [provider]  methimazole (TAPAZOLE) 10 MG tablet Take 10 mg by mouth daily.    [provider]    Physical Exam: Vitals:   06/01/22 0600 06/01/22 0652 06/01/22 0700 06/01/22 0800  BP: (!) 114/95  (!) 108/57 (!) 110/53  Pulse: 94  84 83  Resp: '20  13 14  '$ Temp:  98.7 F (37.1 C)    TempSrc:  Oral    SpO2: 100%  97% 97%  Weight:      Height:       Physical Exam Vitals and nursing note reviewed.  Constitutional:      General: She is awake. She is not in acute distress.    Appearance: Normal appearance.  HENT:     Head: Normocephalic. Contusion present.     Comments: Left periorbital contusion with edema and ecchymosis.    Nose: No rhinorrhea.     Mouth/Throat:     Mouth: Mucous membranes are moist.  Eyes:     General: No scleral icterus.    Pupils: Pupils are equal, round, and reactive to light.  Neck:     Vascular: No JVD.  Cardiovascular:     Rate and Rhythm: Normal rate and regular rhythm.     Heart sounds: S1 normal and S2 normal.  Pulmonary:     Effort: Pulmonary effort is normal.     Breath sounds: Normal breath sounds. No wheezing, rhonchi or rales.  Abdominal:     General: Bowel sounds are normal. There is no distension.     Palpations: Abdomen is soft.     Tenderness: There is no abdominal tenderness.  Musculoskeletal:     Cervical back: Neck supple.     Right lower leg: No edema.     Left lower leg: No edema.  Skin:    General: Skin is warm and dry.     Findings: Ecchymosis and signs of injury present.  Neurological:     General: No focal deficit present.     Mental Status: She is alert and oriented to person, place, and time.   Psychiatric:        Mood and Affect: Mood normal.        Behavior: Behavior normal. Behavior is cooperative.   Data Reviewed:  Results are pending, will review when available.  Assessment and Plan: Principal Problem:   Syncope and collapse Associated with:   Elevated troponin Likely due to demand ischemia. Observation/telemetry. Continue IV fluids. Hold antihypertensives today. Correct electrolyte abnormality. Check carotid Doppler. Check echocardiogram.  Active Problems:   Colitis Continue IV fluids Will cover with ceftriaxone and Flagyl.    Closed fracture of left zygomatic arch (HCC)   Maxillary fracture, left side,  initial encounter for closed fracture (HCC) Analgesics as needed. Follow-up with ENT  next week.    COVID-19 virus infection  Already passed convalescent. She took Paxlovid for several days.    GERD (gastroesophageal reflux disease) Pantoprazole 40 mg IVP x 1.   Then while in the hospital Pantoprazole 40 mg p.o. daily afterwards.    Graves disease On methimazole 5 mg p.o. daily.    Hypokalemia Replacing. Follow-up level in AM.    Hypertension Volume depleted. Hold hydrochlorothiazide today.     Advance Care Planning:   Code Status: Full Code   Consults: Cardiology Phineas Inches, MD).  Family Communication: Her daughter Constance Holster was at bedside.  Severity of Illness: The appropriate patient status for this patient is OBSERVATION. Observation status is judged to be reasonable and necessary in order to provide the required intensity of service to ensure the patient's safety. The patient's presenting symptoms, physical exam findings, and initial radiographic and laboratory data in the context of their medical condition is felt to place them at decreased risk for further clinical deterioration. Furthermore, it is anticipated that the patient will be medically stable for discharge from the hospital within 2 midnights of admission.   Author: Reubin Milan, MD 06/01/2022 8:47 AM  For on call review [email or web address].   This document was prepared using Dragon voice recognition software and may contain some unintended transcription errors.

## 2022-06-01 NOTE — Progress Notes (Signed)
  Echocardiogram 2D Echocardiogram has been performed.  Linda Goodwin 06/01/2022, 3:45 PM

## 2022-06-02 DIAGNOSIS — R55 Syncope and collapse: Secondary | ICD-10-CM | POA: Diagnosis not present

## 2022-06-02 DIAGNOSIS — S0240DA Maxillary fracture, left side, initial encounter for closed fracture: Secondary | ICD-10-CM

## 2022-06-02 DIAGNOSIS — R7989 Other specified abnormal findings of blood chemistry: Secondary | ICD-10-CM | POA: Diagnosis not present

## 2022-06-02 DIAGNOSIS — S0240FA Zygomatic fracture, left side, initial encounter for closed fracture: Secondary | ICD-10-CM

## 2022-06-02 DIAGNOSIS — K529 Noninfective gastroenteritis and colitis, unspecified: Secondary | ICD-10-CM | POA: Diagnosis not present

## 2022-06-02 DIAGNOSIS — E876 Hypokalemia: Secondary | ICD-10-CM

## 2022-06-02 DIAGNOSIS — E0500 Thyrotoxicosis with diffuse goiter without thyrotoxic crisis or storm: Secondary | ICD-10-CM

## 2022-06-02 DIAGNOSIS — U071 COVID-19: Secondary | ICD-10-CM

## 2022-06-02 DIAGNOSIS — K219 Gastro-esophageal reflux disease without esophagitis: Secondary | ICD-10-CM

## 2022-06-02 LAB — CBC
HCT: 31.3 % — ABNORMAL LOW (ref 36.0–46.0)
Hemoglobin: 10.3 g/dL — ABNORMAL LOW (ref 12.0–15.0)
MCH: 31.6 pg (ref 26.0–34.0)
MCHC: 32.9 g/dL (ref 30.0–36.0)
MCV: 96 fL (ref 80.0–100.0)
Platelets: 169 10*3/uL (ref 150–400)
RBC: 3.26 MIL/uL — ABNORMAL LOW (ref 3.87–5.11)
RDW: 11.8 % (ref 11.5–15.5)
WBC: 5.1 10*3/uL (ref 4.0–10.5)
nRBC: 0 % (ref 0.0–0.2)

## 2022-06-02 LAB — COMPREHENSIVE METABOLIC PANEL
ALT: 16 U/L (ref 0–44)
AST: 20 U/L (ref 15–41)
Albumin: 3 g/dL — ABNORMAL LOW (ref 3.5–5.0)
Alkaline Phosphatase: 57 U/L (ref 38–126)
Anion gap: 6 (ref 5–15)
BUN: 8 mg/dL (ref 8–23)
CO2: 24 mmol/L (ref 22–32)
Calcium: 8.1 mg/dL — ABNORMAL LOW (ref 8.9–10.3)
Chloride: 105 mmol/L (ref 98–111)
Creatinine, Ser: 0.59 mg/dL (ref 0.44–1.00)
GFR, Estimated: >60 mL/min (ref >60)
Glucose, Bld: 99 mg/dL (ref 70–99)
Potassium: 4.3 mmol/L (ref 3.5–5.1)
Sodium: 135 mmol/L (ref 135–145)
Total Bilirubin: 0.5 mg/dL (ref 0.3–1.2)
Total Protein: 5.4 g/dL — ABNORMAL LOW (ref 6.5–8.1)

## 2022-06-02 LAB — GLUCOSE, CAPILLARY: Glucose-Capillary: 93 mg/dL (ref 70–99)

## 2022-06-02 MED ORDER — OXYCODONE HCL 5 MG PO TABS: 5.0000 mg | ORAL_TABLET | Freq: Four times a day (QID) | ORAL | 0 refills | Status: AC | PRN

## 2022-06-02 MED ORDER — LOPERAMIDE HCL 2 MG PO TABS: 2.0000 mg | ORAL_TABLET | Freq: Four times a day (QID) | ORAL | 0 refills | Status: AC | PRN

## 2022-06-02 MED ORDER — ONDANSETRON HCL 4 MG PO TABS: 4.0000 mg | ORAL_TABLET | Freq: Every day | ORAL | 1 refills | Status: AC | PRN

## 2022-06-02 NOTE — Evaluation (Signed)
Physical Therapy Evaluation Patient Details Name: Linda Goodwin MRN: [id number] DOB: 12-04-1950 Today's Date: 06/02/2022  History of Present Illness  Linda Goodwin is a 72 y.o. female with medical history significant of GERD, Graves' disease, hypertension, seasonal allergic rhinitis, herpes zoster, grade 1 VIN who is coming to the emergency department after having a syncopal episode.CT head without contrast showed a comminuted left orbit and face, probable tripod fracture.  CT maxillofacial with fracture  Clinical Impression   The  patient  admitted for above medical problems. Patient is ambulatory with no support in room. Family present.  Patient has good family support. No DME, no follow up PT indicated.    PT will sign off.      Recommendations for follow up therapy are one component of a multi-disciplinary discharge planning process, led by the attending physician.  Recommendations may be updated based on patient status, additional functional criteria and insurance authorization.  Follow Up Recommendations No PT follow up      Assistance Recommended at Discharge PRN  Patient can return home with the following  Assistance with cooking/housework;Assist for transportation;Help with stairs or ramp for entrance    Equipment Recommendations None recommended by PT  Recommendations for Other Services       Functional Status Assessment Patient has not had a recent decline in their functional status     Precautions / Restrictions Precautions Precautions: Fall Restrictions Weight Bearing Restrictions: No      Mobility  Bed Mobility Overal bed mobility: Independent                  Transfers Overall transfer level: Independent                      Ambulation/Gait Ambulation/Gait assistance: Independent Gait Distance (Feet): 40 Feet Assistive device: None Gait Pattern/deviations: WFL(Within Functional Limits)       General Gait Details: moves a little  slower/guarded  Stairs            Wheelchair Mobility    Modified Rankin (Stroke Patients Only)       Balance Overall balance assessment: No apparent balance deficits (not formally assessed)                                           Pertinent Vitals/Pain Pain Assessment Pain Assessment: Faces Faces Pain Scale: Hurts a little bit Pain Location: left face Pain Descriptors / Indicators: Discomfort Pain Intervention(s): Monitored during session    Home Living Family/patient expects to be discharged to:: Private residence Living Arrangements: Spouse/significant other;Children Available Help at Discharge: Available 24 hours/day Type of Home: House Home Access: Stairs to enter Entrance Stairs-Rails: Psychiatric nurse of Steps: 3   Home Layout: One level Home Equipment: None      Prior Function Prior Level of Function : Independent/Modified Independent                     Hand Dominance        Extremity/Trunk Assessment   Upper Extremity Assessment Upper Extremity Assessment: Overall WFL for tasks assessed    Lower Extremity Assessment Lower Extremity Assessment: Overall WFL for tasks assessed    Cervical / Trunk Assessment Cervical / Trunk Assessment: Normal  Communication   Communication: No difficulties  Cognition Arousal/Alertness: Awake/alert Behavior During Therapy: WFL for tasks assessed/performed Overall Cognitive Status: Within  Functional Limits for tasks assessed                                          General Comments      Exercises     Assessment/Plan    PT Assessment Patient does not need any further PT services  PT Problem List         PT Treatment Interventions      PT Goals (Current goals can be found in the Care Plan section)  Acute Rehab PT Goals Patient Stated Goal: home PT Goal Formulation: All assessment and education complete, DC therapy    Frequency        Co-evaluation               AM-PAC PT "6 Clicks" Mobility  Outcome Measure Help needed turning from your back to your side while in a flat bed without using bedrails?: None Help needed moving from lying on your back to sitting on the side of a flat bed without using bedrails?: None Help needed moving to and from a bed to a chair (including a wheelchair)?: None Help needed standing up from a chair using your arms (e.g., wheelchair or bedside chair)?: None Help needed to walk in hospital room?: A Little Help needed climbing 3-5 steps with a railing? : A Little 6 Click Score: 22    End of Session   Activity Tolerance: Patient tolerated treatment well Patient left: in bed;with call bell/phone within reach;with family/visitor present Nurse Communication: Mobility status PT Visit Diagnosis: Unsteadiness on feet (R26.81)    Time: 1051-1100 PT Time Calculation (min) (ACUTE ONLY): 9 min   Charges:   PT Evaluation $PT Eval Low Complexity: 1 Low          [place] Office [phone] Weekend pager-[phone]   Claretha Cooper 06/02/2022, 11:04 AM

## 2022-06-02 NOTE — Discharge Summary (Signed)
Physician Discharge Summary  Linda Goodwin JJK:[id number] DOB: [birth year] DOA: 06/01/2022  PCP: Lyman Bishop, DO  Admit date: 06/01/2022 Discharge date: 06/02/2022  Admitted From: Home  Discharge disposition: Home   Recommendations for Outpatient Follow-Up:   Follow up with your primary care provider in one week.  Check CBC, BMP, magnesium in the next visit Follow-up with ENT/plastic surgery as an outpatient for facial trauma.  Discharge Diagnosis:   Principal Problem:   Syncope and collapse Active Problems:   GERD (gastroesophageal reflux disease)   Graves disease   Colitis   Hypokalemia   Hypertension   Elevated troponin   Closed fracture of left zygomatic arch (HCC)   Maxillary fracture, left side, initial encounter for closed fracture ([place])   COVID-19 virus infection   Discharge Condition: Improved.  Diet recommendation: Low sodium, heart healthy.    Wound care: None.  Code status: Full.   History of Present Illness:   Linda Goodwin is a 72 y.o. female with medical history significant of GERD, Graves' disease, hypertension, seasonal allergic rhinitis, herpes zoster, grade 1 VIN presented to hospital after syncope.  Patient did have a abdominal cramps and multiple episodes of diarrhea and while trying to go to the toilet felt dizzy and passed out.  She did hurt herhead as well.  In the ED, vital signs were stable vitals.  Labs showed mild leukocytosis at 14.9 with hypokalemia at 3.3.  X-ray of the chest without any acute findings.  CT head showed  comminuted left orbit and face, probable tripod fracture.  CT maxillofacial with fracture with minimally herniated intraorbital fat.   CT cervical spine with no acute traumatic injury. CT abdomen/pelvis with contrast showed signs compatible with a mild diffuse colitis.  Patient then received IV antiemetics and on Lasix IV fluid bolus and was admitted hospital for further evaluation and treatment.  Hospital Course:    Following conditions were addressed during hospitalization as listed below,  Syncope and collapse   Elevated troponin Syncope was thought to be likely secondary to volume depletion.  Received IV fluid hydration.  Patient has had mild elevation of troponins without any chest pain or EKG changes.  2D echocardiogram with preserved LV function at 70 to 75%.  Seen by cardiology who does not recommend any further testing and outpatient follow-up with cardiology was recommended.  Patient was able to ambulate well without any issues prior to discharge no orthostatic hypotension noted.   Acute colitis Initially patient was on Rocephin and Flagyl IV fluids.  Thought to be viral secondary to COVID virus infection.  Improved at this time.  No further diarrhea.     Closed fracture of left zygomatic arch   Maxillary fracture, left side,  initial encounter for closed fracture ED provider had spoken with ENT on-call Dr. Migdalia Dk- [place] who recommended elevation of the head, sinus precautions and follow-up next week in the clinic and no indication for antibiotic or surgery.  Conservative treatment at this time.  Pain management on discharge.    COVID-19 virus infection  Patient had taken Paxlovid as outpatient.  No leukocytosis or fever.  Continue supportive care.  Afebrile at this time.     GERD (gastroesophageal reflux disease) Continue pantoprazole     Graves disease On methimazole 5 mg p.o. daily.     Hypokalemia Improved after replacement.  Latest potassium of 4.3.     Hypertension On HCTZ as outpatient.  Disposition.  At this time, patient is stable for disposition home  with outpatient PCP and plastic surgery/ENT follow-up.  Medical Consultants:   Verbal consult with plastic surgery  Procedures:    None Subjective:   Today, patient was seen and examined at bedside.  Has mild pain over the periorbital region and streaking of the eyelashes.  Discharge Exam:   Vitals:    06/02/22 1004 06/02/22 1009  BP: (!) 149/67 (!) 156/63  Pulse: 65 71  Resp:    Temp:    SpO2: 98% 100%   Vitals:   06/02/22 0451 06/02/22 1000 06/02/22 1004 06/02/22 1009  BP: (!) 111/59 130/66 (!) 149/67 (!) 156/63  Pulse: 69 61 65 71  Resp: 15 15    Temp: 98.4 F (36.9 C) 98.6 F (37 C)    TempSrc: Oral Oral    SpO2: 97% 98% 98% 100%  Weight: 62.6 kg     Height:        General: Alert awake, not in obvious distress HENT: pupils equally reacting to light,  No scleral pallor or icterus noted. Oral mucosa is moist.  Left periorbital bruise with some sticky discharge over the eyelids.  Vision intact. Chest:  Clear breath sounds.  Diminished breath sounds bilaterally. No crackles or wheezes.  CVS: S1 &S2 heard. No murmur.  Regular rate and rhythm. Abdomen: Soft, nontender, nondistended.  Bowel sounds are heard.   Extremities: No cyanosis, clubbing or edema.  Peripheral pulses are palpable. Psych: Alert, awake and oriented, normal mood CNS:  No cranial nerve deficits.  Power equal in all extremities.   Skin: Warm and dry.  No rashes noted.  The results of significant diagnostics from this hospitalization (including imaging, microbiology, ancillary and laboratory) are listed below for reference.     Diagnostic Studies:   ECHOCARDIOGRAM COMPLETE  Result Date: 06/01/2022    ECHOCARDIOGRAM REPORT   Patient Name:   Linda Goodwin Date of Exam: 06/01/2022 Medical Rec #:  [id number]   Height:       62.0 in Accession #:    [id number]  Weight:       140.0 lb Date of Birth:  [birth year]   BSA:          1.643 m Patient Age:    32 years    BP:           98/58 mmHg Patient Gender: F           HR:           77 bpm. Exam Location:  Inpatient Procedure: 2D Echo and Intracardiac Opacification Agent Indications:    elevated troponin  History:        Patient has no prior history of Echocardiogram examinations.                 Signs/Symptoms:Syncope; Risk Factors:Hypertension.  Sonographer:    Harvie Junior  Referring Phys: [id number] [place]  Sonographer Comments: Technically difficult study due to poor echo windows. Image acquisition challenging due to respiratory motion. IMPRESSIONS  1. Left ventricular ejection fraction, by estimation, is 70 to 75%. The left ventricle has hyperdynamic function. The left ventricle has no regional wall motion abnormalities. Left ventricular diastolic function could not be evaluated.  2. Right ventricular systolic function is normal. The right ventricular size is normal. There is normal pulmonary artery systolic pressure. The estimated right ventricular systolic pressure is 53.6 mmHg.  3. The mitral valve is grossly normal. No evidence of mitral valve regurgitation. No evidence of mitral stenosis.  4. The aortic valve is tricuspid.  Aortic valve regurgitation is mild. No aortic stenosis is present.  5. The inferior vena cava is normal in size with greater than 50% respiratory variability, suggesting right atrial pressure of 3 mmHg. FINDINGS  Left Ventricle: Left ventricular ejection fraction, by estimation, is 70 to 75%. The left ventricle has hyperdynamic function. The left ventricle has no regional wall motion abnormalities. Definity contrast agent was given IV to delineate the left ventricular endocardial borders. The left ventricular internal cavity size was normal in size. There is no left ventricular hypertrophy. Left ventricular diastolic function could not be evaluated due to nondiagnostic images. Left ventricular diastolic function could not be evaluated. Right Ventricle: The right ventricular size is normal. No increase in right ventricular wall thickness. Right ventricular systolic function is normal. There is normal pulmonary artery systolic pressure. The tricuspid regurgitant velocity is 2.65 m/s, and  with an assumed right atrial pressure of 3 mmHg, the estimated right ventricular systolic pressure is 63.1 mmHg. Left Atrium: Left atrial size was normal in size.  Right Atrium: Right atrial size was normal in size. Pericardium: Trivial pericardial effusion is present. Mitral Valve: The mitral valve is grossly normal. No evidence of mitral valve regurgitation. No evidence of mitral valve stenosis. Tricuspid Valve: The tricuspid valve is not well visualized. Tricuspid valve regurgitation is trivial. No evidence of tricuspid stenosis. Aortic Valve: The aortic valve is tricuspid. Aortic valve regurgitation is mild. Aortic regurgitation PHT measures 516 msec. No aortic stenosis is present. Aortic valve mean gradient measures 6.0 mmHg. Aortic valve peak gradient measures 10.1 mmHg. Aortic valve area, by VTI measures 1.94 cm. Pulmonic Valve: The pulmonic valve was grossly normal. Pulmonic valve regurgitation is trivial. Aorta: The aortic root and ascending aorta are structurally normal, with no evidence of dilitation. Venous: The inferior vena cava is normal in size with greater than 50% respiratory variability, suggesting right atrial pressure of 3 mmHg. IAS/Shunts: The atrial septum is grossly normal.  LEFT VENTRICLE PLAX 2D LVIDd:         4.00 cm   Diastology LVIDs:         2.60 cm   LV e' medial:    10.70 cm/s LV PW:         0.70 cm   LV E/e' medial:  9.6 LV IVS:        0.70 cm   LV e' lateral:   10.00 cm/s LVOT diam:     1.70 cm   LV E/e' lateral: 10.3 LV SV:         65 LV SV Index:   39 LVOT Area:     2.27 cm  RIGHT VENTRICLE RV S prime:     16.10 cm/s TAPSE (M-mode): 2.1 cm LEFT ATRIUM         Index LA diam:    2.60 cm 1.58 cm/m  AORTIC VALVE AV Area (Vmax):    1.87 cm AV Area (Vmean):   1.96 cm AV Area (VTI):     1.94 cm AV Vmax:           159.00 cm/s AV Vmean:          113.000 cm/s AV VTI:            0.333 m AV Peak Grad:      10.1 mmHg AV Mean Grad:      6.0 mmHg LVOT Vmax:         131.00 cm/s LVOT Vmean:        97.800 cm/s LVOT VTI:  0.285 m LVOT/AV VTI ratio: 0.86 AI PHT:            516 msec  AORTA Ao Root diam: 3.10 cm Ao Asc diam:  3.10 cm MITRAL VALVE                 TRICUSPID VALVE MV Area (PHT): 3.65 cm     TR Peak grad:   28.1 mmHg MV Decel Time: 208 msec     TR Vmax:        265.00 cm/s MR Peak grad: 35.3 mmHg MR Vmax:      297.00 cm/s   SHUNTS MV E velocity: 103.00 cm/s  Systemic VTI:  0.29 m MV A velocity: 97.30 cm/s   Systemic Diam: 1.70 cm MV E/A ratio:  1.06 Eleonore Chiquito MD Electronically signed by Eleonore Chiquito MD Signature Date/Time: 06/01/2022/5:01:59 PM    Final    CT ABDOMEN PELVIS W CONTRAST  Result Date: 06/01/2022 CLINICAL DATA:  72 year old female with recent COVID-19. Diarrhea. Syncope, fall from toilet. Left eye injury. EXAM: CT ABDOMEN AND PELVIS WITH CONTRAST TECHNIQUE: Multidetector CT imaging of the abdomen and pelvis was performed using the standard protocol following bolus administration of intravenous contrast. RADIATION DOSE REDUCTION: This exam was performed according to the departmental dose-optimization program which includes automated exposure control, adjustment of the mA and/or kV according to patient size and/or use of iterative reconstruction technique. CONTRAST:  169m OMNIPAQUE IOHEXOL 300 MG/ML  SOLN COMPARISON:  None Available. FINDINGS: Lower chest: Lung bases appear clear aside from minimal atelectasis. No cardiomegaly, pericardial effusion, pleural effusion. Hepatobiliary: Negative liver. Partially contracted gallbladder remains within normal limits. No bile duct enlargement. Pancreas: Negative. Spleen: Negative. Adrenals/Urinary Tract: Normal adrenal glands. Symmetric renal enhancement and contrast excretion. No nephrolithiasis, hydronephrosis or renal inflammation identified. Decompressed ureters. Occasional pelvic phleboliths. Unremarkable bladder. Stomach/Bowel: Decompressed and mildly indistinct appearance of the large bowel throughout the abdomen and pelvis to the rectum. Superimposed sigmoid diverticulosis. Appendix remains normal (coronal image 39). Terminal ileum is decompressed. No dilated or convincing  inflamed small bowel. Stomach is decompressed. Possible small gastric hiatal hernia. No free air or free fluid. Vascular/Lymphatic: Mild Calcified aortic atherosclerosis. Major arterial structures appear patent and otherwise normal in the abdomen and pelvis. Portal venous system is patent. No lymphadenopathy identified. Reproductive: Surgically absent uterus. Diminutive and normal ovaries. Other: No pelvic free fluid. Musculoskeletal: Osteopenia. No acute osseous abnormality identified. IMPRESSION: 1. Decompressed and indistinct appearance of the large bowel compatible with a a mild diffuse Colitis. Appendix remains normal. 2. No other acute or inflammatory process identified in the abdomen or pelvis. 3.  Aortic Atherosclerosis (ICD10-I70.0). Electronically Signed   By: HGenevie AnnM.D.   On: 06/01/2022 06:07   CT Maxillofacial Wo Contrast  Result Date: 06/01/2022 CLINICAL DATA:  72year old female with recent COVID-19. Diarrhea. Syncope, fall from toilet. Left eye injury. EXAM: CT MAXILLOFACIAL WITHOUT CONTRAST TECHNIQUE: Multidetector CT imaging of the maxillofacial structures was performed. Multiplanar CT image reconstructions were also generated. RADIATION DOSE REDUCTION: This exam was performed according to the departmental dose-optimization program which includes automated exposure control, adjustment of the mA and/or kV according to patient size and/or use of iterative reconstruction technique. COMPARISON:  CT head and cervical spine today. FINDINGS: Osseous: Mandible intact and normally located. Right TMJ degeneration. Right side zygoma and maxilla intact. No convincing nasal bone fracture. Bilateral pterygoid bones intact. Highly comminuted fractures of the left anterior and posterior maxillary walls, with involvement of the left-side go Matic 0 maxillary  complex and mildly displaced left side Matic arch fracture. Left nasal process of the maxilla remains intact. Orbits: Comminuted left orbital floor  fracture with only trace herniated intraorbital fat. Comminuted minimally displaced left orbit lateral wall fracture. Left orbit roof and lamina papyracea remain intact. Small volume posttraumatic gas within the left orbit, inferior along the inferior rectus muscle. No measurable intraorbital hematoma. Left globe appears intact. Contralateral right orbit walls and soft tissues appear intact. Sinuses: Hemorrhage and herniated fat within the left maxillary sinus (mostly fat is herniated from the retro maxillary space on that side. Comparatively mild opacification of the left frontal sinus and ethmoids. Bilateral sphenoid sinuses are clear. Mild paranasal sinus opacification elsewhere. Tympanic cavities and mastoids remain clear. Soft tissues: Posttraumatic gas in the left premalar space, left masticator space. Hematoma/contusion overlying the lateral left mandible. Bulky postinflammatory dystrophic calcifications of the palatine tonsils. Otherwise negative visible noncontrast deep soft tissue spaces of the face. Limited intracranial: Reported separately. IMPRESSION: 1. Highly comminuted Left Tripod Fracture. Minimally herniated intraorbital fat. Small volume posttraumatic gas within the orbit. No intraorbital hematoma. Hemorrhage and retro maxillary fat within the left maxillary sinus. 2. Posttraumatic soft tissue gas and contusion of the superficial left face, and in the left masticator space. Electronically Signed   By: Genevie Ann M.D.   On: 06/01/2022 05:59   CT Cervical Spine Wo Contrast  Result Date: 06/01/2022 CLINICAL DATA:  72 year old female with recent COVID-19. Diarrhea. Syncope, fall from toilet. Left eye injury. EXAM: CT CERVICAL SPINE WITHOUT CONTRAST TECHNIQUE: Multidetector CT imaging of the cervical spine was performed without intravenous contrast. Multiplanar CT image reconstructions were also generated. RADIATION DOSE REDUCTION: This exam was performed according to the departmental  dose-optimization program which includes automated exposure control, adjustment of the mA and/or kV according to patient size and/or use of iterative reconstruction technique. COMPARISON:  CT head and face today reported separately. FINDINGS: Alignment: Straightening of cervical lordosis. Cervicothoracic junction alignment is within normal limits. Bilateral posterior element alignment is within normal limits. Skull base and vertebrae: Visualized skull base is intact. No atlanto-occipital dissociation. C1 and C2 appear intact and aligned. No acute osseous abnormality identified in the cervical spine. Soft tissues and spinal canal: No prevertebral fluid or swelling. No visible canal hematoma. Negative noncontrast visible neck soft tissues. Disc levels:  Mild for age cervical spine degeneration. Upper chest: Mild to moderate apical lung scarring. Visible upper thoracic levels appear intact. IMPRESSION: 1. No acute traumatic injury identified in the cervical spine. 2. Mild for age cervical spine degeneration. Electronically Signed   By: Genevie Ann M.D.   On: 06/01/2022 05:54   CT Head Wo Contrast  Result Date: 06/01/2022 CLINICAL DATA:  72 year old female with recent COVID-19. Diarrhea. Syncope, fall from toilet. Left eye injury. EXAM: CT HEAD WITHOUT CONTRAST TECHNIQUE: Contiguous axial images were obtained from the base of the skull through the vertex without intravenous contrast. RADIATION DOSE REDUCTION: This exam was performed according to the departmental dose-optimization program which includes automated exposure control, adjustment of the mA and/or kV according to patient size and/or use of iterative reconstruction technique. COMPARISON:  Face CT reported separately. FINDINGS: Brain: Cerebral volume is within normal limits for age. No midline shift, ventriculomegaly, mass effect, evidence of mass lesion, intracranial hemorrhage or evidence of cortically based acute infarction. Gray-white matter differentiation  is within normal limits throughout the brain. Vascular: Mild Calcified atherosclerosis at the skull base. No suspicious intracranial vascular hyperdensity. Skull: Comminuted left orbit and left maxillary fractures.  See face CT details. Left frontal bone and calvarium appear to remain intact. Sinuses/Orbits: Hemorrhage in the left maxillary sinus associated with left facial fractures. Tympanic cavities and mastoids are clear. Other: Left orbital fracture and soft tissue injury detailed on face CT separately. Superior scalp soft tissues remain normal. IMPRESSION: 1. Comminuted left orbit and face, probable tripod fracture, see Face CT reported separately. 2. Normal for age non contrast CT appearance of the brain. Electronically Signed   By: Genevie Ann M.D.   On: 06/01/2022 05:52   DG Chest Portable 1 View  Result Date: 06/01/2022 CLINICAL DATA:  Screening.  Syncope EXAM: PORTABLE CHEST 1 VIEW COMPARISON:  None Available. FINDINGS: The heart size and mediastinal contours are within normal limits. Both lungs are clear. The visualized skeletal structures are unremarkable. IMPRESSION: No active disease. Electronically Signed   By: Placido Sou M.D.   On: 06/01/2022 03:53     Labs:   Basic Metabolic Panel: Recent Labs  Lab 06/01/22 0308 06/01/22 1313 06/02/22 0713  NA 136  --  135  K 3.3*  --  4.3  CL 97*  --  105  CO2 27  --  24  GLUCOSE 130*  --  99  BUN 15  --  8  CREATININE 0.75  --  0.59  CALCIUM 9.9  --  8.1*  MG  --  2.1  --   PHOS  --  3.1  --    GFR Estimated Creatinine Clearance: 56.1 mL/min (by C-G formula based on SCr of 0.59 mg/dL). Liver Function Tests: Recent Labs  Lab 06/01/22 0308 06/02/22 0713  AST 34 20  ALT 23 16  ALKPHOS 72 57  BILITOT 0.6 0.5  PROT 7.3 5.4*  ALBUMIN 4.4 3.0*   Recent Labs  Lab 06/01/22 0308  LIPASE 42   No results for input(s): "AMMONIA" in the last 168 hours. Coagulation profile No results for input(s): "INR", "PROTIME" in the last 168  hours.  CBC: Recent Labs  Lab 06/01/22 0308 06/02/22 0713  WBC 14.9* 5.1  NEUTROABS 13.3*  --   HGB 15.4* 10.3*  HCT 45.3 31.3*  MCV 92.8 96.0  PLT 278 169   Cardiac Enzymes: Recent Labs  Lab 06/01/22 0308  CKTOTAL 51   BNP: Invalid input(s): "POCBNP" CBG: Recent Labs  Lab 06/02/22 0455  GLUCAP 93   D-Dimer No results for input(s): "DDIMER" in the last 72 hours. Hgb A1c No results for input(s): "HGBA1C" in the last 72 hours. Lipid Profile No results for input(s): "CHOL", "HDL", "LDLCALC", "TRIG", "CHOLHDL", "LDLDIRECT" in the last 72 hours. Thyroid function studies No results for input(s): "TSH", "T4TOTAL", "T3FREE", "THYROIDAB" in the last 72 hours.  Invalid input(s): "FREET3" Anemia work up No results for input(s): "VITAMINB12", "FOLATE", "FERRITIN", "TIBC", "IRON", "RETICCTPCT" in the last 72 hours. Microbiology Recent Results (from the past 240 hour(s))  Resp panel by RT-PCR (RSV, Flu A&B, Covid) Anterior Nasal Swab     Status: Abnormal   Collection Time: 06/01/22 12:15 PM   Specimen: Anterior Nasal Swab  Result Value Ref Range Status   SARS Coronavirus 2 by RT PCR POSITIVE (A) NEGATIVE Final    Comment: (NOTE) SARS-CoV-2 target nucleic acids are DETECTED.  The SARS-CoV-2 RNA is generally detectable in upper respiratory specimens during the acute phase of infection. Positive results are indicative of the presence of the identified virus, but do not rule out bacterial infection or co-infection with other pathogens not detected by the test. Clinical correlation with patient  history and other diagnostic information is necessary to determine patient infection status. The expected result is Negative.  Fact Sheet for Patients: EntrepreneurPulse.com.au  Fact Sheet for Healthcare Providers: IncredibleEmployment.be  This test is not yet approved or cleared by the [place] FDA and  has been authorized for detection  and/or diagnosis of SARS-CoV-2 by FDA under an Emergency Use Authorization (EUA).  This EUA will remain in effect (meaning this test can be used) for the duration of  the COVID-19 declaration under Section 564(b)(1) of the A ct, 21 U.S.C. section 360bbb-3(b)(1), unless the authorization is terminated or revoked sooner.     Influenza A by PCR NEGATIVE NEGATIVE Final   Influenza B by PCR NEGATIVE NEGATIVE Final    Comment: (NOTE) The Xpert Xpress SARS-CoV-2/FLU/RSV plus assay is intended as an aid in the diagnosis of influenza from Nasopharyngeal swab specimens and should not be used as a sole basis for treatment. Nasal washings and aspirates are unacceptable for Xpert Xpress SARS-CoV-2/FLU/RSV testing.  Fact Sheet for Patients: EntrepreneurPulse.com.au  Fact Sheet for Healthcare Providers: IncredibleEmployment.be  This test is not yet approved or cleared by the [place] FDA and has been authorized for detection and/or diagnosis of SARS-CoV-2 by FDA under an Emergency Use Authorization (EUA). This EUA will remain in effect (meaning this test can be used) for the duration of the COVID-19 declaration under Section 564(b)(1) of the Act, 21 U.S.C. section 360bbb-3(b)(1), unless the authorization is terminated or revoked.     Resp Syncytial Virus by PCR NEGATIVE NEGATIVE Final    Comment: (NOTE) Fact Sheet for Patients: EntrepreneurPulse.com.au  Fact Sheet for Healthcare Providers: IncredibleEmployment.be  This test is not yet approved or cleared by the [place] FDA and has been authorized for detection and/or diagnosis of SARS-CoV-2 by FDA under an Emergency Use Authorization (EUA). This EUA will remain in effect (meaning this test can be used) for the duration of the COVID-19 declaration under Section 564(b)(1) of the Act, 21 U.S.C. section 360bbb-3(b)(1), unless the authorization is terminated  or revoked.  Performed at [facility], [place] [address] [place], [place] [address]      Discharge Instructions:   Discharge Instructions     Diet general   Complete by: As directed    Discharge instructions   Complete by: As directed    Follow-up with your primary care provider in 1 week.  Seek medical attention for worsening symptoms. Local eye care. Follow up with ENT in 1-2 weeks.  Time to change positions.  Increase fluid intake.   Increase activity slowly   Complete by: As directed       Allergies as of 06/02/2022   No Known Allergies      Medication List     STOP taking these medications    nitrofurantoin (macrocrystal-monohydrate) 100 MG capsule Commonly known as: MACROBID       TAKE these medications    acetaminophen 500 MG tablet Commonly known as: TYLENOL Take 500 mg by mouth every 6 (six) hours as needed for moderate pain.   hydrochlorothiazide 25 MG tablet Commonly known as: HYDRODIURIL Take 25 mg by mouth daily.   loperamide 2 MG tablet Commonly known as: Imodium A-D Take 1 tablet (2 mg total) by mouth 4 (four) times daily as needed for diarrhea or loose stools.   methimazole 10 MG tablet Commonly known as: TAPAZOLE Take 5 mg by mouth daily.   ondansetron 4 MG tablet Commonly known as: Zofran Take 1 tablet (4 mg  total) by mouth daily as needed for nausea or vomiting.   oxyCODONE 5 MG immediate release tablet Commonly known as: Oxy IR/ROXICODONE Take 1 tablet (5 mg total) by mouth every 6 (six) hours as needed for up to 5 days for breakthrough pain, moderate pain or severe pain.        Follow-up Information     Lyman Bishop, DO Follow up in 1 week(s).   Specialty: Family Medicine Contact information: [place] [address] [phone]         Wallace Going, DO Follow up in 1 week(s).   Specialty: Plastic Surgery Contact information: [address] [place]  [address] [phone]                  Time coordinating discharge: 39 minutes  Signed:  Sowmya Partridge  [facility] 06/02/2022, 11:35 AM

## 2022-06-02 NOTE — Progress Notes (Signed)
  Transition of Care [facility]) Screening Note   Patient Details  Name: AKYLAH HASCALL Date of Birth: [birth year]   Transition of Care [facility]) CM/SW Contact:    Henrietta Dine, RN Phone Number: 06/02/2022, 1:10 PM    Transition of Care Department [facility]) has reviewed patient and no TOC needs have been identified at this time. We will continue to monitor patient advancement through interdisciplinary progression rounds. If new patient transition needs arise, please place a TOC consult.

## 2022-06-07 DIAGNOSIS — H6122 Impacted cerumen, left ear: Secondary | ICD-10-CM | POA: Diagnosis not present

## 2022-06-07 DIAGNOSIS — S0240DD Maxillary fracture, left side, subsequent encounter for fracture with routine healing: Secondary | ICD-10-CM | POA: Diagnosis not present

## 2022-06-08 DIAGNOSIS — D649 Anemia, unspecified: Secondary | ICD-10-CM | POA: Diagnosis not present

## 2022-06-08 DIAGNOSIS — R55 Syncope and collapse: Secondary | ICD-10-CM | POA: Diagnosis not present

## 2022-06-08 DIAGNOSIS — E876 Hypokalemia: Secondary | ICD-10-CM | POA: Diagnosis not present

## 2022-07-11 DIAGNOSIS — E871 Hypo-osmolality and hyponatremia: Secondary | ICD-10-CM | POA: Diagnosis not present

## 2022-09-10 DIAGNOSIS — E0500 Thyrotoxicosis with diffuse goiter without thyrotoxic crisis or storm: Secondary | ICD-10-CM | POA: Diagnosis not present

## 2022-09-10 DIAGNOSIS — E0590 Thyrotoxicosis, unspecified without thyrotoxic crisis or storm: Secondary | ICD-10-CM | POA: Diagnosis not present

## 2022-10-25 DIAGNOSIS — H524 Presbyopia: Secondary | ICD-10-CM | POA: Diagnosis not present

## 2022-10-25 DIAGNOSIS — H52229 Regular astigmatism, unspecified eye: Secondary | ICD-10-CM | POA: Diagnosis not present

## 2022-10-25 DIAGNOSIS — H5203 Hypermetropia, bilateral: Secondary | ICD-10-CM | POA: Diagnosis not present

## 2022-10-25 DIAGNOSIS — H5213 Myopia, bilateral: Secondary | ICD-10-CM | POA: Diagnosis not present

## 2022-10-25 DIAGNOSIS — Z0100 Encounter for examination of eyes and vision without abnormal findings: Secondary | ICD-10-CM | POA: Diagnosis not present

## 2022-11-14 DIAGNOSIS — E0500 Thyrotoxicosis with diffuse goiter without thyrotoxic crisis or storm: Secondary | ICD-10-CM | POA: Diagnosis not present

## 2022-11-14 DIAGNOSIS — E7800 Pure hypercholesterolemia, unspecified: Secondary | ICD-10-CM | POA: Diagnosis not present

## 2022-11-23 DIAGNOSIS — I10 Essential (primary) hypertension: Secondary | ICD-10-CM | POA: Diagnosis not present

## 2022-11-23 DIAGNOSIS — Z0000 Encounter for general adult medical examination without abnormal findings: Secondary | ICD-10-CM | POA: Diagnosis not present

## 2022-11-23 DIAGNOSIS — E7800 Pure hypercholesterolemia, unspecified: Secondary | ICD-10-CM | POA: Diagnosis not present

## 2022-11-30 DIAGNOSIS — R300 Dysuria: Secondary | ICD-10-CM | POA: Diagnosis not present

## 2022-12-06 DIAGNOSIS — N762 Acute vulvitis: Secondary | ICD-10-CM | POA: Diagnosis not present

## 2022-12-06 DIAGNOSIS — N760 Acute vaginitis: Secondary | ICD-10-CM | POA: Diagnosis not present

## 2022-12-06 DIAGNOSIS — R300 Dysuria: Secondary | ICD-10-CM | POA: Diagnosis not present

## 2022-12-31 DIAGNOSIS — R300 Dysuria: Secondary | ICD-10-CM | POA: Diagnosis not present

## 2023-02-08 DIAGNOSIS — Z1231 Encounter for screening mammogram for malignant neoplasm of breast: Secondary | ICD-10-CM | POA: Diagnosis not present

## 2023-03-12 DIAGNOSIS — E0500 Thyrotoxicosis with diffuse goiter without thyrotoxic crisis or storm: Secondary | ICD-10-CM | POA: Diagnosis not present

## 2023-03-12 DIAGNOSIS — E0590 Thyrotoxicosis, unspecified without thyrotoxic crisis or storm: Secondary | ICD-10-CM | POA: Diagnosis not present

## 2023-03-18 DIAGNOSIS — E782 Mixed hyperlipidemia: Secondary | ICD-10-CM | POA: Diagnosis not present

## 2023-03-18 DIAGNOSIS — I10 Essential (primary) hypertension: Secondary | ICD-10-CM | POA: Diagnosis not present

## 2023-05-08 DIAGNOSIS — E0500 Thyrotoxicosis with diffuse goiter without thyrotoxic crisis or storm: Secondary | ICD-10-CM | POA: Diagnosis not present

## 2023-07-26 DIAGNOSIS — E0590 Thyrotoxicosis, unspecified without thyrotoxic crisis or storm: Secondary | ICD-10-CM | POA: Diagnosis not present

## 2023-07-26 DIAGNOSIS — E0500 Thyrotoxicosis with diffuse goiter without thyrotoxic crisis or storm: Secondary | ICD-10-CM | POA: Diagnosis not present

## 2023-09-11 DIAGNOSIS — E0590 Thyrotoxicosis, unspecified without thyrotoxic crisis or storm: Secondary | ICD-10-CM | POA: Diagnosis not present

## 2023-09-11 DIAGNOSIS — E0500 Thyrotoxicosis with diffuse goiter without thyrotoxic crisis or storm: Secondary | ICD-10-CM | POA: Diagnosis not present

## 2023-12-09 DIAGNOSIS — H6122 Impacted cerumen, left ear: Secondary | ICD-10-CM | POA: Diagnosis not present

## 2023-12-26 DIAGNOSIS — E0500 Thyrotoxicosis with diffuse goiter without thyrotoxic crisis or storm: Secondary | ICD-10-CM | POA: Diagnosis not present

## 2023-12-26 DIAGNOSIS — J069 Acute upper respiratory infection, unspecified: Secondary | ICD-10-CM | POA: Diagnosis not present

## 2023-12-26 DIAGNOSIS — Z6824 Body mass index (BMI) 24.0-24.9, adult: Secondary | ICD-10-CM | POA: Diagnosis not present

## 2024-02-05 DIAGNOSIS — K219 Gastro-esophageal reflux disease without esophagitis: Secondary | ICD-10-CM | POA: Diagnosis not present

## 2024-02-14 DIAGNOSIS — Z01419 Encounter for gynecological examination (general) (routine) without abnormal findings: Secondary | ICD-10-CM | POA: Diagnosis not present

## 2024-02-14 DIAGNOSIS — Z1272 Encounter for screening for malignant neoplasm of vagina: Secondary | ICD-10-CM | POA: Diagnosis not present

## 2024-02-14 DIAGNOSIS — Z1231 Encounter for screening mammogram for malignant neoplasm of breast: Secondary | ICD-10-CM | POA: Diagnosis not present

## 2024-03-11 DIAGNOSIS — E0590 Thyrotoxicosis, unspecified without thyrotoxic crisis or storm: Secondary | ICD-10-CM | POA: Diagnosis not present

## 2024-03-11 DIAGNOSIS — E0500 Thyrotoxicosis with diffuse goiter without thyrotoxic crisis or storm: Secondary | ICD-10-CM | POA: Diagnosis not present

## 2024-03-18 DIAGNOSIS — R1319 Other dysphagia: Secondary | ICD-10-CM | POA: Diagnosis not present

## 2024-03-18 DIAGNOSIS — K219 Gastro-esophageal reflux disease without esophagitis: Secondary | ICD-10-CM | POA: Diagnosis not present

## 2024-03-23 DIAGNOSIS — Z0000 Encounter for general adult medical examination without abnormal findings: Secondary | ICD-10-CM | POA: Diagnosis not present

## 2024-03-23 DIAGNOSIS — E785 Hyperlipidemia, unspecified: Secondary | ICD-10-CM | POA: Diagnosis not present

## 2024-03-23 DIAGNOSIS — Z23 Encounter for immunization: Secondary | ICD-10-CM | POA: Diagnosis not present

## 2024-03-23 DIAGNOSIS — I10 Essential (primary) hypertension: Secondary | ICD-10-CM | POA: Diagnosis not present

## 2024-03-23 DIAGNOSIS — E0500 Thyrotoxicosis with diffuse goiter without thyrotoxic crisis or storm: Secondary | ICD-10-CM | POA: Diagnosis not present

## 2024-04-21 DIAGNOSIS — Z85828 Personal history of other malignant neoplasm of skin: Secondary | ICD-10-CM | POA: Diagnosis not present

## 2024-04-21 DIAGNOSIS — L821 Other seborrheic keratosis: Secondary | ICD-10-CM | POA: Diagnosis not present

## 2024-04-21 DIAGNOSIS — D225 Melanocytic nevi of trunk: Secondary | ICD-10-CM | POA: Diagnosis not present

## 2024-04-28 DIAGNOSIS — E0500 Thyrotoxicosis with diffuse goiter without thyrotoxic crisis or storm: Secondary | ICD-10-CM | POA: Diagnosis not present

## 2024-06-24 NOTE — Progress Notes (Signed)
 [name]                                          MRN: [id number]   06/24/2024   The [facility] Quality Team Specialist reviewed this patient medical record for the purposes of chart review for care gap closure. The following were reviewed: chart review for care gap closure-controlling blood pressure.    [facility] Quality Team
